# Patient Record
Sex: Female | Born: 1945 | Race: White | Hispanic: No | Marital: Married | Smoking: Former smoker
Health system: Southern US, Community
[De-identification: ages and names within clinical notes are randomized; demographics above are authoritative.]

## PROBLEM LIST (undated history)

## (undated) DIAGNOSIS — N8502 Endometrial intraepithelial neoplasia [EIN]: Secondary | ICD-10-CM

## (undated) DIAGNOSIS — K589 Irritable bowel syndrome without diarrhea: Secondary | ICD-10-CM

## (undated) DIAGNOSIS — E039 Hypothyroidism, unspecified: Secondary | ICD-10-CM

## (undated) DIAGNOSIS — R053 Chronic cough: Secondary | ICD-10-CM

## (undated) DIAGNOSIS — D649 Anemia, unspecified: Secondary | ICD-10-CM

## (undated) DIAGNOSIS — M797 Fibromyalgia: Secondary | ICD-10-CM

## (undated) DIAGNOSIS — G43909 Migraine, unspecified, not intractable, without status migrainosus: Secondary | ICD-10-CM

## (undated) DIAGNOSIS — R5382 Chronic fatigue, unspecified: Secondary | ICD-10-CM

## (undated) DIAGNOSIS — M1990 Unspecified osteoarthritis, unspecified site: Secondary | ICD-10-CM

## (undated) DIAGNOSIS — E785 Hyperlipidemia, unspecified: Secondary | ICD-10-CM

## (undated) DIAGNOSIS — E079 Disorder of thyroid, unspecified: Secondary | ICD-10-CM

## (undated) HISTORY — DX: Endometrial intraepithelial neoplasia (EIN): N85.02

## (undated) HISTORY — DX: Disorder of thyroid, unspecified: E07.9

## (undated) HISTORY — PX: WISDOM TOOTH EXTRACTION: SHX21

## (undated) HISTORY — PX: APPENDECTOMY: SHX54

## (undated) HISTORY — DX: Irritable bowel syndrome, unspecified: K58.9

## (undated) HISTORY — DX: Chronic cough: R05.3

## (undated) HISTORY — PX: OTHER SURGICAL HISTORY: SHX169

## (undated) HISTORY — DX: Chronic fatigue, unspecified: R53.82

## (undated) HISTORY — PX: TONSILLECTOMY: SUR1361

## (undated) HISTORY — PX: TEMPOROMANDIBULAR JOINT SURGERY: SHX35

## (undated) HISTORY — PX: COLONOSCOPY: SHX174

## (undated) HISTORY — DX: Hyperlipidemia, unspecified: E78.5

## (undated) HISTORY — DX: Fibromyalgia: M79.7

---

## 1998-03-17 ENCOUNTER — Encounter: Payer: Self-pay | Admitting: Family Medicine

## 1998-03-17 ENCOUNTER — Ambulatory Visit (HOSPITAL_COMMUNITY): Admission: RE | Admit: 1998-03-17 | Discharge: 1998-03-17 | Payer: Self-pay | Admitting: Family Medicine

## 1998-05-09 ENCOUNTER — Other Ambulatory Visit: Admission: RE | Admit: 1998-05-09 | Discharge: 1998-05-09 | Payer: Self-pay | Admitting: Obstetrics & Gynecology

## 1999-02-15 ENCOUNTER — Ambulatory Visit (HOSPITAL_COMMUNITY): Admission: RE | Admit: 1999-02-15 | Discharge: 1999-02-15 | Payer: Self-pay | Admitting: Obstetrics & Gynecology

## 1999-02-15 ENCOUNTER — Encounter (INDEPENDENT_AMBULATORY_CARE_PROVIDER_SITE_OTHER): Payer: Self-pay

## 1999-11-20 ENCOUNTER — Encounter: Admission: RE | Admit: 1999-11-20 | Discharge: 1999-11-20 | Payer: Self-pay | Admitting: Obstetrics & Gynecology

## 1999-11-20 ENCOUNTER — Encounter: Payer: Self-pay | Admitting: Obstetrics & Gynecology

## 2000-04-25 ENCOUNTER — Encounter (INDEPENDENT_AMBULATORY_CARE_PROVIDER_SITE_OTHER): Payer: Self-pay | Admitting: Specialist

## 2000-04-25 ENCOUNTER — Other Ambulatory Visit: Admission: RE | Admit: 2000-04-25 | Discharge: 2000-04-25 | Payer: Self-pay | Admitting: Plastic Surgery

## 2000-07-25 ENCOUNTER — Other Ambulatory Visit: Admission: RE | Admit: 2000-07-25 | Discharge: 2000-07-25 | Payer: Self-pay | Admitting: Obstetrics & Gynecology

## 2001-01-21 ENCOUNTER — Encounter: Admission: RE | Admit: 2001-01-21 | Discharge: 2001-01-21 | Payer: Self-pay | Admitting: Obstetrics & Gynecology

## 2001-01-21 ENCOUNTER — Encounter: Payer: Self-pay | Admitting: Obstetrics & Gynecology

## 2001-08-05 ENCOUNTER — Other Ambulatory Visit: Admission: RE | Admit: 2001-08-05 | Discharge: 2001-08-05 | Payer: Self-pay | Admitting: Obstetrics & Gynecology

## 2001-09-09 ENCOUNTER — Ambulatory Visit (HOSPITAL_COMMUNITY): Admission: RE | Admit: 2001-09-09 | Discharge: 2001-09-09 | Payer: Self-pay | Admitting: Gastroenterology

## 2001-09-09 ENCOUNTER — Encounter (INDEPENDENT_AMBULATORY_CARE_PROVIDER_SITE_OTHER): Payer: Self-pay | Admitting: Specialist

## 2002-01-27 ENCOUNTER — Encounter: Payer: Self-pay | Admitting: Obstetrics & Gynecology

## 2002-01-27 ENCOUNTER — Encounter: Admission: RE | Admit: 2002-01-27 | Discharge: 2002-01-27 | Payer: Self-pay | Admitting: Obstetrics & Gynecology

## 2002-05-31 ENCOUNTER — Emergency Department (HOSPITAL_COMMUNITY): Admission: EM | Admit: 2002-05-31 | Discharge: 2002-06-01 | Payer: Self-pay | Admitting: Emergency Medicine

## 2002-05-31 ENCOUNTER — Encounter: Payer: Self-pay | Admitting: Emergency Medicine

## 2002-09-01 ENCOUNTER — Other Ambulatory Visit: Admission: RE | Admit: 2002-09-01 | Discharge: 2002-09-01 | Payer: Self-pay | Admitting: Obstetrics & Gynecology

## 2003-02-08 ENCOUNTER — Encounter: Payer: Self-pay | Admitting: Internal Medicine

## 2003-04-30 ENCOUNTER — Encounter: Admission: RE | Admit: 2003-04-30 | Discharge: 2003-04-30 | Payer: Self-pay | Admitting: Obstetrics & Gynecology

## 2003-10-13 ENCOUNTER — Other Ambulatory Visit: Admission: RE | Admit: 2003-10-13 | Discharge: 2003-10-13 | Payer: Self-pay | Admitting: Obstetrics & Gynecology

## 2004-02-01 ENCOUNTER — Ambulatory Visit: Payer: Self-pay | Admitting: Internal Medicine

## 2004-05-09 ENCOUNTER — Encounter: Admission: RE | Admit: 2004-05-09 | Discharge: 2004-05-09 | Payer: Self-pay | Admitting: Obstetrics & Gynecology

## 2004-11-08 ENCOUNTER — Other Ambulatory Visit: Admission: RE | Admit: 2004-11-08 | Discharge: 2004-11-08 | Payer: Self-pay | Admitting: Obstetrics & Gynecology

## 2005-05-16 ENCOUNTER — Encounter: Admission: RE | Admit: 2005-05-16 | Discharge: 2005-05-16 | Payer: Self-pay | Admitting: Obstetrics & Gynecology

## 2006-01-28 ENCOUNTER — Ambulatory Visit: Payer: Self-pay | Admitting: Internal Medicine

## 2006-02-08 ENCOUNTER — Ambulatory Visit: Payer: Self-pay | Admitting: Internal Medicine

## 2006-02-08 LAB — CONVERTED CEMR LAB
Cholesterol: 212 mg/dL (ref 0–200)
LDL DIRECT: 138.4 mg/dL
T3, Free: 2.8 pg/mL (ref 2.3–4.2)
VLDL: 11 mg/dL (ref 0–40)

## 2006-03-08 ENCOUNTER — Ambulatory Visit: Payer: Self-pay | Admitting: Internal Medicine

## 2006-04-24 ENCOUNTER — Ambulatory Visit: Payer: Self-pay | Admitting: Internal Medicine

## 2006-06-12 ENCOUNTER — Encounter: Admission: RE | Admit: 2006-06-12 | Discharge: 2006-06-12 | Payer: Self-pay | Admitting: Obstetrics & Gynecology

## 2006-08-28 DIAGNOSIS — R946 Abnormal results of thyroid function studies: Secondary | ICD-10-CM

## 2006-08-28 DIAGNOSIS — R5382 Chronic fatigue, unspecified: Secondary | ICD-10-CM

## 2006-08-29 ENCOUNTER — Encounter: Payer: Self-pay | Admitting: Internal Medicine

## 2006-08-29 ENCOUNTER — Ambulatory Visit: Payer: Self-pay | Admitting: Internal Medicine

## 2006-08-29 LAB — CONVERTED CEMR LAB
Basophils Absolute: 0 10*3/uL (ref 0.0–0.1)
Eosinophils Absolute: 0.1 10*3/uL (ref 0.0–0.6)
Lymphocytes Relative: 36.8 % (ref 12.0–46.0)
MCHC: 34.2 g/dL (ref 30.0–36.0)
MCV: 91.5 fL (ref 78.0–100.0)
Monocytes Relative: 10 % (ref 3.0–11.0)
Platelets: 212 10*3/uL (ref 150–400)
RBC: 4.42 M/uL (ref 3.87–5.11)
Rhuematoid fact SerPl-aCnc: <20 intl units/mL — ABNORMAL LOW (ref 0.0–20.0)
Sed Rate: 8 mm/hr (ref 0–25)
Total CK: 47 units/L (ref 7–177)
Uric Acid, Serum: 2.9 mg/dL (ref 2.4–7.0)
WBC: 5.6 10*3/uL (ref 4.5–10.5)

## 2006-09-09 ENCOUNTER — Telehealth (INDEPENDENT_AMBULATORY_CARE_PROVIDER_SITE_OTHER): Payer: Self-pay | Admitting: *Deleted

## 2007-02-10 ENCOUNTER — Encounter: Payer: Self-pay | Admitting: Internal Medicine

## 2007-06-13 ENCOUNTER — Encounter: Admission: RE | Admit: 2007-06-13 | Discharge: 2007-06-13 | Payer: Self-pay | Admitting: Obstetrics & Gynecology

## 2007-06-23 ENCOUNTER — Encounter: Payer: Self-pay | Admitting: Internal Medicine

## 2007-12-15 ENCOUNTER — Encounter: Payer: Self-pay | Admitting: Internal Medicine

## 2008-01-15 ENCOUNTER — Encounter: Payer: Self-pay | Admitting: Internal Medicine

## 2008-07-30 ENCOUNTER — Encounter: Admission: RE | Admit: 2008-07-30 | Discharge: 2008-07-30 | Payer: Self-pay | Admitting: Obstetrics & Gynecology

## 2008-08-02 ENCOUNTER — Telehealth (INDEPENDENT_AMBULATORY_CARE_PROVIDER_SITE_OTHER): Payer: Self-pay | Admitting: *Deleted

## 2008-08-03 ENCOUNTER — Ambulatory Visit: Payer: Self-pay | Admitting: Internal Medicine

## 2008-08-03 LAB — CONVERTED CEMR LAB
BUN: 15 mg/dL (ref 6–23)
Bilirubin, Direct: 0 mg/dL (ref 0.0–0.3)
Cholesterol: 208 mg/dL — ABNORMAL HIGH (ref 0–200)
Direct LDL: 127.6 mg/dL
Eosinophils Relative: 1.2 % (ref 0.0–5.0)
Free T4: 0.8 ng/dL (ref 0.6–1.6)
HDL: 71.5 mg/dL (ref >39.00)
Hemoglobin: 13 g/dL (ref 12.0–15.0)
Lymphocytes Relative: 33.8 % (ref 12.0–46.0)
Lymphs Abs: 1.8 10*3/uL (ref 0.7–4.0)
Monocytes Relative: 8.4 % (ref 3.0–12.0)
Potassium: 4.7 meq/L (ref 3.5–5.1)
RBC: 4.01 M/uL (ref 3.87–5.11)
T3, Free: 2.1 pg/mL — ABNORMAL LOW (ref 2.3–4.2)
Total Bilirubin: 0.6 mg/dL (ref 0.3–1.2)
Total CHOL/HDL Ratio: 3
Total Protein: 7 g/dL (ref 6.0–8.3)
VLDL: 12.4 mg/dL (ref 0.0–40.0)

## 2008-08-05 LAB — CONVERTED CEMR LAB: Vit D, 25-Hydroxy: 34 ng/mL (ref 30–89)

## 2008-08-06 ENCOUNTER — Encounter (INDEPENDENT_AMBULATORY_CARE_PROVIDER_SITE_OTHER): Payer: Self-pay | Admitting: *Deleted

## 2008-08-12 ENCOUNTER — Ambulatory Visit: Payer: Self-pay | Admitting: Internal Medicine

## 2008-08-12 DIAGNOSIS — H9319 Tinnitus, unspecified ear: Secondary | ICD-10-CM | POA: Insufficient documentation

## 2008-08-12 DIAGNOSIS — E785 Hyperlipidemia, unspecified: Secondary | ICD-10-CM

## 2008-12-20 ENCOUNTER — Encounter: Payer: Self-pay | Admitting: Internal Medicine

## 2009-01-26 ENCOUNTER — Telehealth (INDEPENDENT_AMBULATORY_CARE_PROVIDER_SITE_OTHER): Payer: Self-pay | Admitting: *Deleted

## 2009-02-14 ENCOUNTER — Encounter: Payer: Self-pay | Admitting: Internal Medicine

## 2009-02-14 ENCOUNTER — Ambulatory Visit: Payer: Self-pay | Admitting: Internal Medicine

## 2009-03-18 ENCOUNTER — Encounter (INDEPENDENT_AMBULATORY_CARE_PROVIDER_SITE_OTHER): Payer: Self-pay | Admitting: *Deleted

## 2009-08-17 ENCOUNTER — Encounter: Admission: RE | Admit: 2009-08-17 | Discharge: 2009-08-17 | Payer: Self-pay | Admitting: Obstetrics & Gynecology

## 2009-11-11 ENCOUNTER — Ambulatory Visit: Payer: Self-pay | Admitting: Internal Medicine

## 2009-11-11 DIAGNOSIS — M25519 Pain in unspecified shoulder: Secondary | ICD-10-CM

## 2009-11-11 DIAGNOSIS — M2550 Pain in unspecified joint: Secondary | ICD-10-CM | POA: Insufficient documentation

## 2009-11-11 DIAGNOSIS — M545 Low back pain: Secondary | ICD-10-CM

## 2009-11-11 LAB — CONVERTED CEMR LAB
Blood in Urine, dipstick: NEGATIVE
Glucose, Urine, Semiquant: NEGATIVE
Protein, U semiquant: NEGATIVE
WBC Urine, dipstick: NEGATIVE
pH: 5

## 2009-11-14 LAB — CONVERTED CEMR LAB: Sed Rate: 9 mm/hr (ref 0–22)

## 2009-12-08 ENCOUNTER — Encounter: Payer: Self-pay | Admitting: Internal Medicine

## 2009-12-09 ENCOUNTER — Ambulatory Visit: Payer: Self-pay | Admitting: Internal Medicine

## 2009-12-16 ENCOUNTER — Telehealth (INDEPENDENT_AMBULATORY_CARE_PROVIDER_SITE_OTHER): Payer: Self-pay | Admitting: *Deleted

## 2010-01-12 ENCOUNTER — Encounter: Payer: Self-pay | Admitting: Internal Medicine

## 2010-02-06 ENCOUNTER — Telehealth: Payer: Self-pay | Admitting: Internal Medicine

## 2010-02-27 ENCOUNTER — Encounter: Payer: Self-pay | Admitting: Internal Medicine

## 2010-03-31 ENCOUNTER — Encounter: Payer: Self-pay | Admitting: Internal Medicine

## 2010-05-02 NOTE — Progress Notes (Signed)
Summary: Neurosurgeon referral  Phone Note Call from Patient Call back at Home Phone [phone]   Summary of Call: Patient has been seen by Dr. 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 Dr. Danielle Dess has been recommended and she wants to know if you agree on this MD or if you prefer someone else. Please advise.  Patient would also like to know if you will make this referral or should it be made by someone else. Initial call taken by: Lucious Groves CMA,  February 06, 2010 10:10 AM  Follow-up for Phone Call        Patient notified that Hop concurs with this MD and that he has not received anything on this patient. She is aware that referral should be done by Dr. Ethelene Hal. Follow-up by: Lucious Groves CMA,  February 06, 2010 10:49 AM

## 2010-05-02 NOTE — Assessment & Plan Note (Signed)
Summary: BACK PAIN/KN   Vital Signs:  Patient profile:   65 year old female Weight:      144.6 pounds Temp:     97.9 degrees F oral Pulse rate:   72 / minute Resp:     15 per minute BP sitting:   120 / 82  (left arm) Cuff size:   large  Vitals Entered By: Shonna Chock CMA (December 09, 2009 8:18 AM) CC: Onging Back pain and arm concerns, back pain is much worse   CC:  Onging Back pain and arm concerns and back pain is much worse.  History of Present Illness: Dr Felicita Gage ,PT ,has been treating her with concentration on LUE. Her back is aculely worse as of 12/06/2009 after standing up. The patient denies fever, chills, weakness, loss of sensation, fecal incontinence, urinary incontinence, and urinary retention.  The sharp  pain is located in the right  buttock &  radiates to the right leg behind  the knee.  The pain is made worse by turning to either side in bed.  Current Medications (verified): 1)  Vivelle 0.1 Mg/24hr Pttw (Estradiol) .... 2 X Weekly 2)  Prometrium 100 Mg Caps (Progesterone Micronized) .... First 15 Days of Every Other Month 3)  Synthroid 50 Mcg Tabs (Levothyroxine Sodium) .Marland Kitchen.. 1 1/2 Tab Daily 4)  Trazodone Hcl 100 Mg Tabs (Trazodone Hcl) .Marland Kitchen.. 1 By Mouth At Bedtime 5)  Klonopin 0.5 Mg Tabs (Clonazepam) .... 1/4 Tab At Bedtime 6)  Finesteride 5mg  .... 1/2 By Mouth Once Daily 7)  Tramadol Hcl 50 Mg Tabs (Tramadol Hcl) .... As Needed 8)  Ambien 10 Mg Tabs (Zolpidem Tartrate) .Marland Kitchen.. 1 By Mouth At Bedtime As Needed 9)  Singulair 10 Mg Tabs (Montelukast Sodium) .Marland Kitchen.. 1 Once Daily As Needed Allergies & Dry Cough  Allergies: 1)  ! Sulfa 2)  ! Hydrocodone 3)  ! Nasonex (Mometasone Furoate)  Physical Exam  General:  Thin but well-nourished,in no acute distress; alert,appropriate and cooperative throughout examination Extremities:  No clubbing, cyanosis, edema.Neg SLR Neurologic:  alert & oriented X3, strength normal in all extremities, heel/toe gait normal, and DTRs  symmetrical and normal.   Skin:  Intact without suspicious lesions or rashes   Impression & Recommendations:  Problem # 1:  LOW BACK PAIN SYNDROME (ICD-724.2)  Her updated medication list for this problem includes:    Tramadol Hcl 50 Mg Tabs (Tramadol hcl) .Marland Kitchen... As needed    Cyclobenzaprine Hcl 5 Mg Tabs (Cyclobenzaprine hcl) .Marland Kitchen... 1-2 at bedtime as needed  Orders: T-Lumbar Spine Complete, 5 Views (71110TC)  Complete Medication List: 1)  Vivelle 0.1 Mg/24hr Pttw (Estradiol) .... 2 x weekly 2)  Prometrium 100 Mg Caps (Progesterone micronized) .... First 15 days of every other month 3)  Synthroid 50 Mcg Tabs (Levothyroxine sodium) .Marland Kitchen.. 1 1/2 tab daily 4)  Trazodone Hcl 100 Mg Tabs (Trazodone hcl) .Marland Kitchen.. 1 by mouth at bedtime 5)  Klonopin 0.5 Mg Tabs (Clonazepam) .... 1/4 tab at bedtime 6)  Finesteride 5mg   .... 1/2 by mouth once daily 7)  Tramadol Hcl 50 Mg Tabs (Tramadol hcl) .... As needed 8)  Ambien 10 Mg Tabs (Zolpidem tartrate) .Marland Kitchen.. 1 by mouth at bedtime as needed 9)  Singulair 10 Mg Tabs (Montelukast sodium) .Marland Kitchen.. 1 once daily as needed allergies & dry cough 10)  Gabapentin 100 Mg Caps (Gabapentin) .Marland Kitchen.. 1 every 8 hrs as needed for leg pain 11)  Cyclobenzaprine Hcl 5 Mg Tabs (Cyclobenzaprine hcl) .Marland Kitchen.. 1-2 at bedtime as  needed  Patient Instructions: 1)  Consider referral to Dr Sheran Luz , Back Specialist  if no better. Prescriptions: CYCLOBENZAPRINE HCL 5 MG TABS (CYCLOBENZAPRINE HCL) 1-2 at bedtime as needed  #20 x 0   Entered and Authorized by:   Marga Melnick MD   Signed by:   Marga Melnick MD on 12/09/2009   Method used:   Print then Give to Patient   RxID:   [id number] GABAPENTIN 100 MG CAPS (GABAPENTIN) 1 every 8 hrs as needed for leg pain  #30 x 1   Entered and Authorized by:   Marga Melnick MD   Signed by:   Marga Melnick MD on 12/09/2009   Method used:   Print then Give to Patient   RxID:   [id number]

## 2010-05-02 NOTE — Progress Notes (Signed)
Summary: tramadol not helping shoulder  Phone Note Call from Patient Call back at Home Phone [phone]   Caller: Patient Summary of Call: patient called and would like to get something stronger than tramadol for shoulder pain b/c she says that it isn't helping says the neurontin helps w/ hip pain but she had a hard time doing physical therapy today due to shoulder discomfort and says that physical therapist things she may have so issue going on in her neck instead of rotator cuff so she was in traction and done some heat therapy today.  Hop pls advise.  [facility]  Initial call taken by: Doristine Devoid CMA,  December 16, 2009 4:23 PM  Follow-up for Phone Call        Tylenol # 3 if she can take this #30.1-2 every 6 hrs as needed . Orthopdic referral needed if no better Follow-up by: Marga Melnick MD,  December 16, 2009 4:48 PM  Additional Follow-up for Phone Call Additional follow up Details #1::        spoke w/ patient aware prescription to be called into pharmacy and would like to go ahead w/ orthopaedic referral...Marland KitchenMarland KitchenDoristine Devoid CMA  December 16, 2009 4:54 PM     New/Updated Medications: TYLENOL WITH CODEINE #3 300-30 MG TABS (ACETAMINOPHEN-CODEINE) take 1-2 tablets every 6 hours as needed for pain Prescriptions: TYLENOL WITH CODEINE #3 300-30 MG TABS (ACETAMINOPHEN-CODEINE) take 1-2 tablets every 6 hours as needed for pain  #30 x 0   Entered by:   Doristine Devoid CMA   Authorized by:   Marga Melnick MD   Signed by:   Doristine Devoid CMA on 12/16/2009   Method used:   Printed then faxed to ...       [facility]  [address] [phone]* (retail)       [address]       [place], [place]  [address]       Ph: [phone] or [phone]       Fax: [phone]   RxID:   [id number]

## 2010-05-02 NOTE — Letter (Signed)
Summary: [facility]   Imported By: Lanelle Bal 01/24/2010 10:42:10  _____________________________________________________________________  External Attachment:    Type:   Image     Comment:   External Document

## 2010-05-02 NOTE — Assessment & Plan Note (Signed)
Summary: lower bacl pain & shoulder pain/cbs   Vital Signs:  Patient profile:   65 year old female Weight:      145 pounds BMI:     22.62 Temp:     97.7 degrees F oral Pulse rate:   64 / minute Pulse rhythm:   regular Resp:     15 per minute BP sitting:   102 / 68  (left arm)  Vitals Entered By: Almeta Monas CMA Duncan Dull) (November 11, 2009 10:21 AM) CC: c/o back pain in the tailbone area and left shoulder pain. Pt is concerned about arthritis and she also requested a refill on her singular 10mg ., Back pain, Lower Extremity Joint pain   CC:  c/o back pain in the tailbone area and left shoulder pain. Pt is concerned about arthritis and she also requested a refill on her singular 10mg ., Back pain, and Lower Extremity Joint pain.  History of Present Illness:  Back Pain      This is a 65 year old woman who presents with Back pain for several months w/o trigger or injury.  The patient denies fever, chills, weakness, loss of sensation, fecal incontinence, urinary incontinence, urinary retention, dysuria, and inability to care for self.  The pain is located in the mid low back.  The pain began gradually.  The pain radiates to the right hip.  The pain is made worse by activity; it is actually worse in am but better with Tramadol ,  NSAID medications and ice.  Risk factors for serious underlying conditions include duration of pain > 1 month and age >= 50 years.   L Shoulder Joint Pain      The patient also presents with L shoulder pain.  The patient reports stiffness for >1 hr, decreased ROM, and weakness, but denies swelling, redness, locking, and popping.  The pain is located in the left  shoulder & upper arm.  The pain began without  injury.  She had rotator cuff impingement in the RIGHT shoulder after a fall 2008.This responded to Physical Therapy; the L shoulder pain is "EXACTLY" like that in 2008. The pain is described as aching - sharp  and constant.  To date   evaluation of L shoulder has  included Xray & manipulation  by Chiropractry & massage w/o lasting response  .  The patient denies the following symptoms: fever, photosensitivity, eye symptoms, and diarrhea.   FH of Lupus ( aunt)  & strong FH "arthritis"  Current Medications (verified): 1)  Vivelle 0.1 Mg/24hr Pttw (Estradiol) .... 2 X Weekly 2)  Prometrium 100 Mg Caps (Progesterone Micronized) .... First 15 Days of Every Other Month 3)  Synthroid 50 Mcg Tabs (Levothyroxine Sodium) .Marland Kitchen.. 1 1/2 Tab Daily 4)  Trazodone Hcl 100 Mg Tabs (Trazodone Hcl) .Marland Kitchen.. 1 By Mouth At Bedtime 5)  Klonopin 0.5 Mg Tabs (Clonazepam) .... 1/4 Tab At Bedtime 6)  Finesteride 5mg  .... 1/2 By Mouth Once Daily 7)  Tramadol Hcl 50 Mg Tabs (Tramadol Hcl) .... As Needed 8)  Ambien 10 Mg Tabs (Zolpidem Tartrate) .Marland Kitchen.. 1 By Mouth At Bedtime As Needed 9)  Singulair 10 Mg Tabs (Montelukast Sodium) .Marland Kitchen.. 1 Once Daily As Needed Allergies & Dry Cough  Allergies (verified): 1)  ! Sulfa 2)  ! Hydrocodone 3)  ! Nasonex (Mometasone Furoate)  Review of Systems General:  Denies chills, sweats, and weight loss. Eyes:  Denies discharge, eye pain, red eye, and vision loss-both eyes. GU:  Denies discharge and hematuria. MS:  Occasional stiffness of fingers in am for < 60 min. Occa pain in knees & inguinal areas.  Physical Exam  General:  Thin  but well-nourished,in no acute distress; alert,appropriate and cooperative throughout examination Eyes:  No corneal or conjunctival inflammation noted.Perrla. No icterus Neck:  No deformities, masses, or tenderness noted. Good ROM Abdomen:  Bowel sounds positive,abdomen soft and non-tender without masses, organomegaly or hernias noted. Msk:  No deformity or scoliosis noted of thoracic or lumbar spine.   Extremities:  No clubbing, cyanosis, edema, or deformity noted with normal full range of motion of all joints.  Neg SLR . Mild crepitus L knee. Decreased ROM LUE @ shoulder  Neurologic:  alert & oriented X3, strength normal  in all extremities, gait normal, and DTRs symmetrical and normal.   Skin:  Intact without suspicious lesions or rashes. No jaundice Cervical Nodes:  No lymphadenopathy noted Axillary Nodes:  No palpable lymphadenopathy Psych:  memory intact for recent and remote and normally interactive.     Impression & Recommendations:  Problem # 1:  LOW BACK PAIN SYNDROME (ICD-724.2)  Her updated medication list for this problem includes:    Tramadol Hcl 50 Mg Tabs (Tramadol hcl) .Marland Kitchen... As needed  Orders: Physical Therapy Referral (PT)  Problem # 2:  SHOULDER PAIN, LEFT (ICD-719.41)  Her updated medication list for this problem includes:    Tramadol Hcl 50 Mg Tabs (Tramadol hcl) .Marland Kitchen... As needed  Orders: Physical Therapy Referral (PT)  Problem # 3:  ARTHRALGIA (ICD-719.40)  Diffuse joint involvement  Orders: Venipuncture (91478) TLB-ALT (SGPT) (84460-ALT) TLB-AST (SGOT) (84450-SGOT) TLB-Sedimentation Rate (ESR) (85652-ESR) TLB-Rheumatoid Factor (RA) (29562-ZH)  Complete Medication List: 1)  Vivelle 0.1 Mg/24hr Pttw (Estradiol) .... 2 x weekly 2)  Prometrium 100 Mg Caps (Progesterone micronized) .... First 15 days of every other month 3)  Synthroid 50 Mcg Tabs (Levothyroxine sodium) .Marland Kitchen.. 1 1/2 tab daily 4)  Trazodone Hcl 100 Mg Tabs (Trazodone hcl) .Marland Kitchen.. 1 by mouth at bedtime 5)  Klonopin 0.5 Mg Tabs (Clonazepam) .... 1/4 tab at bedtime 6)  Finesteride 5mg   .... 1/2 by mouth once daily 7)  Tramadol Hcl 50 Mg Tabs (Tramadol hcl) .... As needed 8)  Ambien 10 Mg Tabs (Zolpidem tartrate) .Marland Kitchen.. 1 by mouth at bedtime as needed 9)  Singulair 10 Mg Tabs (Montelukast sodium) .Marland Kitchen.. 1 once daily as needed allergies & dry cough  Other Orders: UA Dipstick w/o Micro (manual) (08657)   Laboratory Results   Urine Tests    Routine Urinalysis   Color: yellow Appearance: Clear Glucose: negative   (Normal Range: Negative) Bilirubin: negative   (Normal Range: Negative) Ketone: negative   (Normal  Range: Negative) Spec. Gravity: <1.005   (Normal Range: 1.003-1.035) Blood: negative   (Normal Range: Negative) pH: 5.0   (Normal Range: 5.0-8.0) Protein: negative   (Normal Range: Negative) Urobilinogen: 0.2   (Normal Range: 0-1) Nitrite: negative   (Normal Range: Negative) Leukocyte Esterace: negative   (Normal Range: Negative)       Appended Document: lower bacl pain & shoulder pain/cbs

## 2010-05-02 NOTE — Miscellaneous (Signed)
Summary: PT Report/[facility]  PT Report/[facility]   Imported By: Lanelle Bal 12/20/2009 10:17:58  _____________________________________________________________________  External Attachment:    Type:   Image     Comment:   External Document

## 2010-05-04 NOTE — Letter (Signed)
Summary: [facility]  [facility]   Imported By: Lanelle Bal 03/15/2010 13:18:44  _____________________________________________________________________  External Attachment:    Type:   Image     Comment:   External Document

## 2010-05-09 ENCOUNTER — Encounter: Payer: Self-pay | Admitting: Internal Medicine

## 2010-05-18 NOTE — Letter (Signed)
Summary: [facility]  [facility]   Imported By: Maryln Gottron 05/12/2010 10:47:17  _____________________________________________________________________  External Attachment:    Type:   Image     Comment:   External Document

## 2010-05-24 NOTE — Miscellaneous (Signed)
Summary: Discharge Summary/[facility]  Discharge Summary/[facility]   Imported By: Maryln Gottron 05/15/2010 13:42:25  _____________________________________________________________________  External Attachment:    Type:   Image     Comment:   External Document

## 2010-08-18 NOTE — Letter (Signed)
April 24, 2006    Tasia Catchings, M.D.  [address]  [address]  [place],  [place] [address]   RE:  [name], EY  MRN:  [id number]  /  DOB:  [birth year]   Dear Rosanne Ashing:   Helen Gardner is scheduled to see you in June 2008 for followup colonoscopy. She  has had two colonoscopies because of family history of colon cancer and  because of irritable bowel symptoms associated with rectal bleeding  intermittently.   Specifically, her mother had colon cancer and also pancreatic cancer.  Her maternal grandfather had pancreatic cancer and maternal great-  grandfather had colon cancer.   She has had irritable bowel symptoms for 15 years, but there has been  progression of symptoms over the last year. She actually has had  intermittent bleeding, but the major symptoms are alternating  constipation and diarrhea.   Her weight was down approximately 2-1/2 pounds to 138. She had no  lymphadenopathy or organomegaly. She is thin and her aorta is palpable,  but there is no aneurysm.   I did provide her with samples of Align, pending her evaluation by you.   Other past medical history includes thyroglossal duct cyst removal,  tonsillectomy, appendectomy, two pregnancies, temporal mandibular joint  dysfunction and chronic fatigue.  She has normal bone density and is on adequate calcium; she has had  vitamin D deficiency demonstrated.   Thank you for your evaluation and recommendations.    Sincerely,      Titus Dubin. Alwyn Ren, MD,FACP,FCCP  Electronically Signed    [facility]  DD: 04/24/2006  DT: 04/24/2006  Job #: [id number]

## 2010-08-18 NOTE — Op Note (Signed)
[facility]  Patient:    Helen Gardner                         MRN: [id number] Proc. Date: 02/15/99 Adm. Date:  [id number] Attending:  Minette Headland                           Operative Report  PREOPERATIVE DIAGNOSIS:       Dysfunctional uterine bleeding, on hormone-replacement therapy, with ultrasound diagnosis of endometrial polyp.  POSTOPERATIVE DIAGNOSIS:      Endometrial polyp.  OPERATIVE PROCEDURE:          Hysteroscopy, dilatation and curettage and removal of polyp.  SURGEON:                      Freddy Finner, M.D.  ANESTHESIA:                   General.  INTRAOPERATIVE COMPLICATIONS:  None.  ESTIMATED INTRAOPERATIVE BLOOD LOSS:  Less than 50 cc.  SORBITOL DEFICIT FROM DISTENDING MEDIUM:  -50 cc.  INDICATIONS:                  Patient is a 65 year old who is on hormone-replacement therapy and has had dysfunction bleeding.  Pelvic ultrasound did reveal a mass in the uterus consistent with an endometrial polyp.  She is admitted now for hysteroscopy and D&C.  FINDINGS:                     Intraoperative finding did show one single polyp n the anterior fundal surface of the endometrium.  Photographs were made and retained in the office record.  DESCRIPTION OF PROCEDURE:     The patient was placed in the dorsal lithotomy position, placed under adequate general anesthesia, placed in the dorsal recumbent position and then placed in lithotomy using the Allen stirrup system.  Betadine  prep of perineum and vagina was carried out.  Sterile drapes were applied. Bivalved speculum was introduced.  Cervix was grasped with a single-tooth tenaculum and progressively dilated to 23 with Pratts.  The ACMI 25-degree diagnostic scope was used, using sorbitol with a pump for the distending medium.  Inspection revealed a polyp on the anterior fundal surface which was photographed.  The uterus was then explored with polyp forceps and gentle  thorough curettage was carried ut with a small sharp curette.  Reinspection revealed adequate removal of the polyp and adequate sampling of the endometrium.  The procedure was terminated at this  point.  The instruments were removed.  Patient was taken to recovery in good condition.  She will be given routine outpatient surgical instructions and follow up in the  office in a week. DD:  02/15/99 TD:  02/15/99 Job: [id number]

## 2010-09-18 ENCOUNTER — Other Ambulatory Visit: Payer: Self-pay | Admitting: Obstetrics & Gynecology

## 2010-09-18 DIAGNOSIS — Z1231 Encounter for screening mammogram for malignant neoplasm of breast: Secondary | ICD-10-CM

## 2010-09-27 ENCOUNTER — Ambulatory Visit
Admission: RE | Admit: 2010-09-27 | Discharge: 2010-09-27 | Disposition: A | Discharge status: Home / Self Care | Payer: BC Managed Care – PPO | Source: Ambulatory Visit | Attending: Obstetrics & Gynecology | Admitting: Obstetrics & Gynecology

## 2010-09-27 DIAGNOSIS — Z1231 Encounter for screening mammogram for malignant neoplasm of breast: Secondary | ICD-10-CM

## 2010-11-09 ENCOUNTER — Telehealth: Payer: Self-pay | Admitting: Internal Medicine

## 2010-11-09 NOTE — Telephone Encounter (Signed)
Patient has cpx scheduled [id number] - lab scheduled [phone] - need lab order

## 2010-11-10 NOTE — Telephone Encounter (Signed)
Lab order added to appt °

## 2010-11-10 NOTE — Telephone Encounter (Signed)
Lipid/Hep/BMP/CBCD/TSH/Stool Cards v70.0/272.4/794.5/995.20

## 2011-01-05 ENCOUNTER — Ambulatory Visit (INDEPENDENT_AMBULATORY_CARE_PROVIDER_SITE_OTHER): Payer: BC Managed Care – PPO

## 2011-01-05 ENCOUNTER — Inpatient Hospital Stay (INDEPENDENT_AMBULATORY_CARE_PROVIDER_SITE_OTHER)
Admission: RE | Admit: 2011-01-05 | Discharge: 2011-01-05 | Disposition: A | Discharge status: Home / Self Care | Payer: BC Managed Care – PPO | Source: Ambulatory Visit | Attending: Family Medicine | Admitting: Family Medicine

## 2011-01-05 DIAGNOSIS — S8000XA Contusion of unspecified knee, initial encounter: Secondary | ICD-10-CM

## 2011-01-05 DIAGNOSIS — S93409A Sprain of unspecified ligament of unspecified ankle, initial encounter: Secondary | ICD-10-CM

## 2011-01-09 ENCOUNTER — Other Ambulatory Visit: Payer: Self-pay | Admitting: Internal Medicine

## 2011-01-10 ENCOUNTER — Other Ambulatory Visit (INDEPENDENT_AMBULATORY_CARE_PROVIDER_SITE_OTHER): Payer: BC Managed Care – PPO

## 2011-01-10 ENCOUNTER — Other Ambulatory Visit: Payer: BC Managed Care – PPO

## 2011-01-10 DIAGNOSIS — E785 Hyperlipidemia, unspecified: Secondary | ICD-10-CM

## 2011-01-10 DIAGNOSIS — Z0000 Encounter for general adult medical examination without abnormal findings: Secondary | ICD-10-CM

## 2011-01-10 DIAGNOSIS — R946 Abnormal results of thyroid function studies: Secondary | ICD-10-CM

## 2011-01-10 DIAGNOSIS — T887XXA Unspecified adverse effect of drug or medicament, initial encounter: Secondary | ICD-10-CM

## 2011-01-10 LAB — HEPATIC FUNCTION PANEL
ALT: 15 U/L (ref 0–35)
AST: 23 U/L (ref 0–37)
Albumin: 4.2 g/dL (ref 3.5–5.2)
Total Bilirubin: 0.2 mg/dL — ABNORMAL LOW (ref 0.3–1.2)

## 2011-01-10 LAB — BASIC METABOLIC PANEL
BUN: 19 mg/dL (ref 6–23)
CO2: 28 mEq/L (ref 19–32)
Calcium: 9.4 mg/dL (ref 8.4–10.5)
Chloride: 108 mEq/L (ref 96–112)
Creatinine, Ser: 0.8 mg/dL (ref 0.4–1.2)
GFR: 81.63 mL/min (ref >60.00)
Potassium: 5.8 mEq/L — ABNORMAL HIGH (ref 3.5–5.1)

## 2011-01-10 LAB — LIPID PANEL
Cholesterol: 247 mg/dL — ABNORMAL HIGH (ref 0–200)
Total CHOL/HDL Ratio: 3

## 2011-01-10 LAB — CBC WITH DIFFERENTIAL/PLATELET
MCHC: 33.2 g/dL (ref 30.0–36.0)
Monocytes Absolute: 0.6 10*3/uL (ref 0.1–1.0)
Monocytes Relative: 8.8 % (ref 3.0–12.0)
Neutro Abs: 4 10*3/uL (ref 1.4–7.7)
RBC: 4.13 Mil/uL (ref 3.87–5.11)
RDW: 13.3 % (ref 11.5–14.6)

## 2011-01-10 LAB — LDL CHOLESTEROL, DIRECT: Direct LDL: 154.3 mg/dL

## 2011-01-10 NOTE — Progress Notes (Signed)
Labs only

## 2011-01-15 LAB — HEMOGLOBIN A1C: Hgb A1c MFr Bld: 5.3 % (ref 4.6–6.5)

## 2011-01-18 ENCOUNTER — Encounter: Payer: Self-pay | Admitting: Internal Medicine

## 2011-01-18 ENCOUNTER — Ambulatory Visit (INDEPENDENT_AMBULATORY_CARE_PROVIDER_SITE_OTHER): Payer: BC Managed Care – PPO | Admitting: Internal Medicine

## 2011-01-18 VITALS — BP 122/84 | HR 69 | Temp 97.7°F | Resp 12 | Ht 67.25 in | Wt 149.0 lb

## 2011-01-18 DIAGNOSIS — M25471 Effusion, right ankle: Secondary | ICD-10-CM

## 2011-01-18 DIAGNOSIS — G9332 Myalgic encephalomyelitis/chronic fatigue syndrome: Secondary | ICD-10-CM

## 2011-01-18 DIAGNOSIS — M25579 Pain in unspecified ankle and joints of unspecified foot: Secondary | ICD-10-CM

## 2011-01-18 DIAGNOSIS — M25571 Pain in right ankle and joints of right foot: Secondary | ICD-10-CM

## 2011-01-18 DIAGNOSIS — R5382 Chronic fatigue, unspecified: Secondary | ICD-10-CM

## 2011-01-18 DIAGNOSIS — Z0000 Encounter for general adult medical examination without abnormal findings: Secondary | ICD-10-CM

## 2011-01-18 DIAGNOSIS — E785 Hyperlipidemia, unspecified: Secondary | ICD-10-CM

## 2011-01-18 DIAGNOSIS — M25476 Effusion, unspecified foot: Secondary | ICD-10-CM

## 2011-01-18 DIAGNOSIS — R946 Abnormal results of thyroid function studies: Secondary | ICD-10-CM

## 2011-01-18 NOTE — Progress Notes (Signed)
Subjective:    Patient ID: Helen Gardner, female    DOB: 1945-09-01, 65 y.o.   MRN: [id number]  HPI  [name]  is here for a physical;acute issues include trauma to R knee & ankle post fall on steep , slick grade 2 weeks ago with residual swelling @ ankle      Review of Systems Thyroid function monitor  Medications status(change in dose/brand/mode of administration):no Constitutional: Weight change: weight up 5 #; Fatigue:chronic; Sleep pattern:intermittent awakening; Appetite:good  Visual change(blurred/diplopia/visual loss):no Hoarseness:no; Swallowing issues:no Cardiovascular: Palpitations:no; Racing:no; Irregularity:no GI: Constipation:no; Diarrhea:intermittently loose to watery Derm: Change in nails/hair/skin:hair loss Neuro: Numbness/tingling:no; Tremor:no Psych: Anxiety:no; Depression:no Endo: Temperature intolerance: Heat:@ night; Cold:no        Objective:   Physical Exam Gen.: Thin but healthy and well-nourished in appearance. Alert, appropriate and cooperative throughout exam. Head: Normocephalic without obvious abnormalities  Eyes: No corneal or conjunctival inflammation noted. Pupils equal round reactive to light and accommodation. Fundal exam is benign without hemorrhages, exudate, papilledema. Extraocular motion intact. Vision grossly normal with lenses. No lid lag. Ears: External  ear exam reveals no significant lesions or deformities. Canals clear .TMs normal. Hearing is grossly normal bilaterally. Nose: External nasal exam reveals no deformity or inflammation. Nasal mucosa are pink and moist. No lesions or exudates noted. Septum  : slight dislocation to L  Mouth: Oral mucosa and oropharynx reveal no lesions or exudates. Teeth in good repair. Neck: No deformities, masses, or tenderness noted. Range of motion &. Thyroid normal. Lungs: Normal respiratory effort; chest expands symmetrically. Lungs are clear to auscultation without rales, wheezes, or increased work of  breathing. Heart: Normal rate and rhythm. Normal S1 ; accentuated  S2. No gallop, click, or rub. Grade 1/6 LLSB systolic  murmur. Abdomen: Bowel sounds normal; abdomen soft and nontender. No masses, organomegaly or hernias noted. Aorta palpable Genitalia: Dr Jennette Kettle   .                                                                                   Musculoskeletal/extremities: No deformity or scoliosis noted of  the thoracic or lumbar spine. No clubbing, cyanosis,  noted. Range of motion  normal .Tone & strength  Normal.Joints:MINIMAL DIP changes. Nail health  Good. Fusiform changes R ankle Vascular: Carotid, radial artery, dorsalis pedis and  posterior tibial pulses are full and equal. No bruits present. Neurologic: Alert and oriented x3. Deep tendon reflexes symmetrical and normal.          Skin: Intact without suspicious lesions or rashes. Lymph: No cervical, axillary  lymphadenopathy present. Psych: Mood and affect are normal. Normally interactive                                                                                         Assessment & Plan:  #1 comprehensive physical  exam; no acute findings #2 see Problem List with Assessments & Recommendations  #3 posttraumatic swelling and pain right ankle  #4 significantly suppressed TSH  #5 LDL not at goal of at least less than 120. HDL is protective Plan: see Orders   EKG reveals occasional PACs. This may be related to the excess thyroid replacement.

## 2011-01-18 NOTE — Patient Instructions (Addendum)
Please review Dr Gildardo Griffes book Eat, Drink & Be Healthy for dietary cholesterol information. Please  schedule fasting Labs : [facility]  Lipid Panel  (no uric acid or TSH to be included in that panel) ; TSH after 10 weeks of dietary changes to optimally assess LDL risk.  Please bring these instructions to that Lab appt. Please decrease Synthroid from  125 mcg daily to 125 mcg daily except 100 mcg  on Wednesdays. TSH goal would be 1-3

## 2011-03-22 DIAGNOSIS — L219 Seborrheic dermatitis, unspecified: Secondary | ICD-10-CM | POA: Insufficient documentation

## 2011-05-25 ENCOUNTER — Other Ambulatory Visit: Payer: Self-pay | Admitting: Internal Medicine

## 2011-05-25 DIAGNOSIS — T887XXA Unspecified adverse effect of drug or medicament, initial encounter: Secondary | ICD-10-CM

## 2011-05-25 DIAGNOSIS — E039 Hypothyroidism, unspecified: Secondary | ICD-10-CM

## 2011-05-25 DIAGNOSIS — E785 Hyperlipidemia, unspecified: Secondary | ICD-10-CM

## 2011-05-28 ENCOUNTER — Other Ambulatory Visit: Payer: BC Managed Care – PPO

## 2011-05-28 DIAGNOSIS — T887XXA Unspecified adverse effect of drug or medicament, initial encounter: Secondary | ICD-10-CM

## 2011-05-28 DIAGNOSIS — E785 Hyperlipidemia, unspecified: Secondary | ICD-10-CM

## 2011-05-28 DIAGNOSIS — E039 Hypothyroidism, unspecified: Secondary | ICD-10-CM

## 2011-05-28 NOTE — Progress Notes (Unsigned)
Pt refused to have tsh drawn but did draw the [facility] labs

## 2011-06-25 ENCOUNTER — Telehealth: Payer: Self-pay | Admitting: *Deleted

## 2011-06-25 NOTE — Telephone Encounter (Signed)
Please verify results sent

## 2011-06-25 NOTE — Telephone Encounter (Signed)
Pt calling for results of labs done 05-28-11.Pt also had [facility] .Please advise

## 2011-06-26 NOTE — Telephone Encounter (Signed)
Patient called back and left message on voicemail indicating she would like report mailed to her

## 2011-06-26 NOTE — Telephone Encounter (Signed)
Left message on voicemail informing patient I have [facility] lab on my desk to be mailed out to patient. Patient can pick up if she would like quicker access. Patient to call and inform if ok to mail or if she would like to pick-up.

## 2011-07-13 ENCOUNTER — Encounter: Payer: Self-pay | Admitting: Internal Medicine

## 2011-07-13 ENCOUNTER — Ambulatory Visit (INDEPENDENT_AMBULATORY_CARE_PROVIDER_SITE_OTHER): Payer: BC Managed Care – PPO | Admitting: Internal Medicine

## 2011-07-13 VITALS — BP 120/70 | HR 64 | Wt 151.4 lb

## 2011-07-13 DIAGNOSIS — R946 Abnormal results of thyroid function studies: Secondary | ICD-10-CM

## 2011-07-13 DIAGNOSIS — E785 Hyperlipidemia, unspecified: Secondary | ICD-10-CM

## 2011-07-13 NOTE — Assessment & Plan Note (Signed)
These  advanced lipid tests do not suggest any significant increased long-term risk. I would have no concern unless the LDL rose above 160 and the HDL was less than 50.  Based on the special report she was concerned because of a insulin of 4. She is not insulin deficient as documented by her hemoglobin A1c 5.3%

## 2011-07-13 NOTE — Progress Notes (Signed)
  Subjective:    Patient ID: Helen Gardner, female    DOB: October 01, 1945, 66 y.o.   MRN: [id number]  HPI Dyslipidemia assessment: Prior Advanced Lipid Testing: both NMR & [place] [facility] Panel   Family history of premature CAD/ MI: no;MGF  MI @ 30 .  Nutrition: Mediaterranean .  Exercise: walking 2-3 mpd 4-5 X/week . Diabetes :no; A1c 5.3% in 10/12 . HTN: no. Smoking history  : quit 2000 .    Lab results reviewed : no risk identified    Review of Systems Weight :  Up 10 # over 18 mos.  fatigue: no ; chest pain : no ;claudication: no; palpitations: no; abd pain/bowel changes: alternating loose stool stool & constipation with IBS ; myalgias:no;  syncope :no ; memory loss: no;skin/hair/nail change: hair loss & nails thin.     Objective:   Physical Exam Gen.: Thin but well-nourished; in no acute distress Eyes: Extraocular motion intact; no lid lag or proptosis Neck: thyroid normal Heart: Normal rhythm and rate without significant murmur, gallop, or extra heart sounds Lungs: Chest clear to auscultation without rales,rales, wheezes Neuro:Deep tendon reflexes are equal and within normal limits; no tremor  Skin: Warm and dry without significant lesions or rashes; no onycholysis Psych: Normally communicative and interactive; no abnormal mood or affect clinically.         Assessment & Plan:

## 2011-07-13 NOTE — Assessment & Plan Note (Addendum)
Dr. Talmage Nap  has prescribed Armour Thyroid and is adjusting the dose. The last TSH in our records was 0.19 on 01/10/11. The risk of osteopenia exacerbation discussed with TSH less than 0.35.

## 2011-07-13 NOTE — Patient Instructions (Signed)
Preventive Health Care: Exercise  30-45  minutes a day, 3-4 days a week. Walking is especially valuable in preventing Osteoporosis. Eat a low-fat diet with lots of fruits and vegetables, up to 7-9 servings per day.  

## 2011-10-17 ENCOUNTER — Other Ambulatory Visit: Payer: Self-pay | Admitting: Obstetrics & Gynecology

## 2011-10-17 DIAGNOSIS — Z1231 Encounter for screening mammogram for malignant neoplasm of breast: Secondary | ICD-10-CM

## 2011-10-19 ENCOUNTER — Ambulatory Visit
Admission: RE | Admit: 2011-10-19 | Discharge: 2011-10-19 | Disposition: A | Discharge status: Home / Self Care | Payer: BC Managed Care – PPO | Source: Ambulatory Visit | Attending: Obstetrics & Gynecology | Admitting: Obstetrics & Gynecology

## 2011-10-19 DIAGNOSIS — Z1231 Encounter for screening mammogram for malignant neoplasm of breast: Secondary | ICD-10-CM

## 2012-07-01 ENCOUNTER — Other Ambulatory Visit: Payer: Self-pay | Admitting: Obstetrics & Gynecology

## 2012-11-04 ENCOUNTER — Other Ambulatory Visit: Payer: Self-pay

## 2012-11-04 DIAGNOSIS — Z1231 Encounter for screening mammogram for malignant neoplasm of breast: Secondary | ICD-10-CM

## 2012-11-13 DIAGNOSIS — E039 Hypothyroidism, unspecified: Secondary | ICD-10-CM | POA: Diagnosis not present

## 2012-11-17 DIAGNOSIS — N950 Postmenopausal bleeding: Secondary | ICD-10-CM | POA: Diagnosis not present

## 2012-11-17 DIAGNOSIS — N8500 Endometrial hyperplasia, unspecified: Secondary | ICD-10-CM | POA: Diagnosis not present

## 2012-11-21 ENCOUNTER — Ambulatory Visit
Admission: RE | Admit: 2012-11-21 | Discharge: 2012-11-21 | Disposition: A | Discharge status: Home / Self Care | Payer: Medicare Other | Source: Ambulatory Visit

## 2012-11-21 DIAGNOSIS — Z1231 Encounter for screening mammogram for malignant neoplasm of breast: Secondary | ICD-10-CM

## 2013-01-08 DIAGNOSIS — H2510 Age-related nuclear cataract, unspecified eye: Secondary | ICD-10-CM | POA: Diagnosis not present

## 2013-05-13 DIAGNOSIS — L719 Rosacea, unspecified: Secondary | ICD-10-CM | POA: Diagnosis not present

## 2013-05-13 DIAGNOSIS — L219 Seborrheic dermatitis, unspecified: Secondary | ICD-10-CM | POA: Diagnosis not present

## 2013-05-13 DIAGNOSIS — L659 Nonscarring hair loss, unspecified: Secondary | ICD-10-CM | POA: Diagnosis not present

## 2013-05-13 DIAGNOSIS — L570 Actinic keratosis: Secondary | ICD-10-CM | POA: Diagnosis not present

## 2013-06-10 DIAGNOSIS — Z130 Encounter for screening for diseases of the blood and blood-forming organs and certain disorders involving the immune mechanism: Secondary | ICD-10-CM | POA: Diagnosis not present

## 2013-06-10 DIAGNOSIS — Z124 Encounter for screening for malignant neoplasm of cervix: Secondary | ICD-10-CM | POA: Diagnosis not present

## 2013-11-20 ENCOUNTER — Other Ambulatory Visit: Payer: Self-pay

## 2013-11-20 DIAGNOSIS — Z1231 Encounter for screening mammogram for malignant neoplasm of breast: Secondary | ICD-10-CM

## 2013-12-01 ENCOUNTER — Ambulatory Visit
Admission: RE | Admit: 2013-12-01 | Discharge: 2013-12-01 | Disposition: A | Discharge status: Home / Self Care | Payer: Medicare Other | Source: Ambulatory Visit

## 2013-12-01 DIAGNOSIS — Z1231 Encounter for screening mammogram for malignant neoplasm of breast: Secondary | ICD-10-CM

## 2013-12-03 ENCOUNTER — Other Ambulatory Visit: Payer: Self-pay | Admitting: Obstetrics & Gynecology

## 2013-12-03 DIAGNOSIS — R928 Other abnormal and inconclusive findings on diagnostic imaging of breast: Secondary | ICD-10-CM

## 2013-12-09 DIAGNOSIS — N951 Menopausal and female climacteric states: Secondary | ICD-10-CM | POA: Diagnosis not present

## 2013-12-09 DIAGNOSIS — L659 Nonscarring hair loss, unspecified: Secondary | ICD-10-CM | POA: Diagnosis not present

## 2013-12-09 DIAGNOSIS — IMO0001 Reserved for inherently not codable concepts without codable children: Secondary | ICD-10-CM | POA: Diagnosis not present

## 2013-12-09 DIAGNOSIS — G4700 Insomnia, unspecified: Secondary | ICD-10-CM | POA: Diagnosis not present

## 2013-12-09 DIAGNOSIS — M7130 Other bursal cyst, unspecified site: Secondary | ICD-10-CM | POA: Diagnosis not present

## 2013-12-09 DIAGNOSIS — E039 Hypothyroidism, unspecified: Secondary | ICD-10-CM | POA: Diagnosis not present

## 2013-12-09 DIAGNOSIS — M25569 Pain in unspecified knee: Secondary | ICD-10-CM | POA: Diagnosis not present

## 2013-12-09 DIAGNOSIS — Z1331 Encounter for screening for depression: Secondary | ICD-10-CM | POA: Diagnosis not present

## 2013-12-10 DIAGNOSIS — E039 Hypothyroidism, unspecified: Secondary | ICD-10-CM | POA: Diagnosis not present

## 2013-12-11 ENCOUNTER — Ambulatory Visit
Admission: RE | Admit: 2013-12-11 | Discharge: 2013-12-11 | Disposition: A | Discharge status: Home / Self Care | Payer: Medicare Other | Source: Ambulatory Visit | Attending: Obstetrics & Gynecology | Admitting: Obstetrics & Gynecology

## 2013-12-11 DIAGNOSIS — R928 Other abnormal and inconclusive findings on diagnostic imaging of breast: Secondary | ICD-10-CM

## 2014-01-11 DIAGNOSIS — H00034 Abscess of left upper eyelid: Secondary | ICD-10-CM | POA: Diagnosis not present

## 2014-01-29 DIAGNOSIS — E039 Hypothyroidism, unspecified: Secondary | ICD-10-CM | POA: Diagnosis not present

## 2014-01-29 DIAGNOSIS — Z79899 Other long term (current) drug therapy: Secondary | ICD-10-CM | POA: Diagnosis not present

## 2014-01-29 DIAGNOSIS — Z008 Encounter for other general examination: Secondary | ICD-10-CM | POA: Diagnosis not present

## 2014-01-29 DIAGNOSIS — R5383 Other fatigue: Secondary | ICD-10-CM | POA: Diagnosis not present

## 2014-02-05 DIAGNOSIS — E039 Hypothyroidism, unspecified: Secondary | ICD-10-CM | POA: Diagnosis not present

## 2014-02-05 DIAGNOSIS — L659 Nonscarring hair loss, unspecified: Secondary | ICD-10-CM | POA: Diagnosis not present

## 2014-02-05 DIAGNOSIS — M797 Fibromyalgia: Secondary | ICD-10-CM | POA: Diagnosis not present

## 2014-02-05 DIAGNOSIS — H9319 Tinnitus, unspecified ear: Secondary | ICD-10-CM | POA: Diagnosis not present

## 2014-02-05 DIAGNOSIS — Z008 Encounter for other general examination: Secondary | ICD-10-CM | POA: Diagnosis not present

## 2014-02-05 DIAGNOSIS — N951 Menopausal and female climacteric states: Secondary | ICD-10-CM | POA: Diagnosis not present

## 2014-02-05 DIAGNOSIS — M25562 Pain in left knee: Secondary | ICD-10-CM | POA: Diagnosis not present

## 2014-02-05 DIAGNOSIS — R5383 Other fatigue: Secondary | ICD-10-CM | POA: Diagnosis not present

## 2014-04-12 DIAGNOSIS — H524 Presbyopia: Secondary | ICD-10-CM | POA: Diagnosis not present

## 2014-04-12 DIAGNOSIS — H2510 Age-related nuclear cataract, unspecified eye: Secondary | ICD-10-CM | POA: Diagnosis not present

## 2014-04-12 DIAGNOSIS — H5203 Hypermetropia, bilateral: Secondary | ICD-10-CM | POA: Diagnosis not present

## 2014-05-14 DIAGNOSIS — E039 Hypothyroidism, unspecified: Secondary | ICD-10-CM | POA: Diagnosis not present

## 2014-05-26 DIAGNOSIS — L718 Other rosacea: Secondary | ICD-10-CM | POA: Diagnosis not present

## 2014-05-26 DIAGNOSIS — L400 Psoriasis vulgaris: Secondary | ICD-10-CM | POA: Diagnosis not present

## 2014-06-16 DIAGNOSIS — L400 Psoriasis vulgaris: Secondary | ICD-10-CM | POA: Diagnosis not present

## 2014-06-16 DIAGNOSIS — D225 Melanocytic nevi of trunk: Secondary | ICD-10-CM | POA: Diagnosis not present

## 2014-06-16 DIAGNOSIS — L821 Other seborrheic keratosis: Secondary | ICD-10-CM | POA: Diagnosis not present

## 2014-06-16 DIAGNOSIS — L718 Other rosacea: Secondary | ICD-10-CM | POA: Diagnosis not present

## 2014-07-13 DIAGNOSIS — Z1389 Encounter for screening for other disorder: Secondary | ICD-10-CM | POA: Diagnosis not present

## 2014-07-13 DIAGNOSIS — Z6824 Body mass index (BMI) 24.0-24.9, adult: Secondary | ICD-10-CM | POA: Diagnosis not present

## 2014-07-13 DIAGNOSIS — E039 Hypothyroidism, unspecified: Secondary | ICD-10-CM | POA: Diagnosis not present

## 2014-08-17 DIAGNOSIS — Z01419 Encounter for gynecological examination (general) (routine) without abnormal findings: Secondary | ICD-10-CM | POA: Diagnosis not present

## 2014-08-17 DIAGNOSIS — Z6825 Body mass index (BMI) 25.0-25.9, adult: Secondary | ICD-10-CM | POA: Diagnosis not present

## 2014-09-06 DIAGNOSIS — E039 Hypothyroidism, unspecified: Secondary | ICD-10-CM | POA: Diagnosis not present

## 2014-09-29 DIAGNOSIS — K642 Third degree hemorrhoids: Secondary | ICD-10-CM | POA: Diagnosis not present

## 2014-10-13 DIAGNOSIS — K642 Third degree hemorrhoids: Secondary | ICD-10-CM | POA: Diagnosis not present

## 2014-11-01 DIAGNOSIS — E039 Hypothyroidism, unspecified: Secondary | ICD-10-CM | POA: Diagnosis not present

## 2014-11-03 DIAGNOSIS — K644 Residual hemorrhoidal skin tags: Secondary | ICD-10-CM | POA: Diagnosis not present

## 2014-11-03 DIAGNOSIS — K641 Second degree hemorrhoids: Secondary | ICD-10-CM | POA: Diagnosis not present

## 2014-11-24 DIAGNOSIS — K642 Third degree hemorrhoids: Secondary | ICD-10-CM | POA: Diagnosis not present

## 2014-12-20 ENCOUNTER — Ambulatory Visit (INDEPENDENT_AMBULATORY_CARE_PROVIDER_SITE_OTHER): Payer: Medicare Other | Admitting: Licensed Clinical Social Worker

## 2014-12-20 DIAGNOSIS — F419 Anxiety disorder, unspecified: Secondary | ICD-10-CM | POA: Diagnosis not present

## 2015-01-07 ENCOUNTER — Ambulatory Visit (INDEPENDENT_AMBULATORY_CARE_PROVIDER_SITE_OTHER): Payer: Medicare Other | Admitting: Licensed Clinical Social Worker

## 2015-01-07 DIAGNOSIS — F419 Anxiety disorder, unspecified: Secondary | ICD-10-CM

## 2015-01-19 DIAGNOSIS — R262 Difficulty in walking, not elsewhere classified: Secondary | ICD-10-CM | POA: Diagnosis not present

## 2015-01-19 DIAGNOSIS — M79672 Pain in left foot: Secondary | ICD-10-CM | POA: Diagnosis not present

## 2015-01-19 DIAGNOSIS — Z6824 Body mass index (BMI) 24.0-24.9, adult: Secondary | ICD-10-CM | POA: Diagnosis not present

## 2015-01-19 DIAGNOSIS — M25572 Pain in left ankle and joints of left foot: Secondary | ICD-10-CM | POA: Diagnosis not present

## 2015-01-28 ENCOUNTER — Ambulatory Visit (INDEPENDENT_AMBULATORY_CARE_PROVIDER_SITE_OTHER): Payer: Medicare Other | Admitting: Licensed Clinical Social Worker

## 2015-01-28 DIAGNOSIS — F419 Anxiety disorder, unspecified: Secondary | ICD-10-CM

## 2015-02-01 ENCOUNTER — Other Ambulatory Visit: Payer: Self-pay

## 2015-02-01 DIAGNOSIS — Z1231 Encounter for screening mammogram for malignant neoplasm of breast: Secondary | ICD-10-CM

## 2015-02-08 ENCOUNTER — Ambulatory Visit (INDEPENDENT_AMBULATORY_CARE_PROVIDER_SITE_OTHER): Payer: Medicare Other | Admitting: Podiatry

## 2015-02-08 ENCOUNTER — Encounter: Payer: Self-pay | Admitting: Podiatry

## 2015-02-08 ENCOUNTER — Ambulatory Visit: Payer: Medicare Other

## 2015-02-08 ENCOUNTER — Telehealth: Payer: Self-pay | Admitting: *Deleted

## 2015-02-08 VITALS — BP 137/76 | HR 62 | Resp 16

## 2015-02-08 DIAGNOSIS — M779 Enthesopathy, unspecified: Secondary | ICD-10-CM

## 2015-02-08 DIAGNOSIS — M19079 Primary osteoarthritis, unspecified ankle and foot: Secondary | ICD-10-CM

## 2015-02-08 DIAGNOSIS — G5792 Unspecified mononeuropathy of left lower limb: Secondary | ICD-10-CM

## 2015-02-08 DIAGNOSIS — M129 Arthropathy, unspecified: Secondary | ICD-10-CM | POA: Diagnosis not present

## 2015-02-08 NOTE — Progress Notes (Signed)
   Subjective:    Patient ID: Helen Gardner, female    DOB: 14-Jun-1945, 69 y.o.   MRN: [id number]  HPI: She presents today as a 69 year old white female with a chief complaint of pain swelling numbness and tingling that started July 2016 after wearing a sandal with a tight strap. She denies trauma. She had her primary care provider evaluate the foot and perform an radiograph which was read as negative. She states that she had the radiographs ready to bring to [place] today however she left those at home. She states that he provider were her with anti-inflammatories all to no avail.    Review of Systems  All other systems reviewed and are negative.      Objective:   Physical Exam: 69 year old white female in no apparent distress presents vital signs stable alert and oriented 3 pulses are strongly palpable. Neurologic sensorium is intact per Semmes-Weinstein monofilament. Deep tendon reflexes are intact muscle strength +5 over 5 dorsiflexion plantar flexors and inverters everters all intrinsic musculature is intact. Orthopedic evaluation demonstrates all joints distal to the ankle for range of motion without crepitus he does have some tenderness of Lisfranc's joint on frontal plane range of motion. She has thickening of the Lisfranc's joints on physical exam and she also has tenderness on palpation of the deep peroneal nerve on the left foot as opposed to the right foot. She also has a mild very small area of fluctuance underlying the deep peroneal nerve overlying the metatarsocuneiform joints. She refused additional radiographs today. Cutaneous evaluation of a straight supple well-hydrated cutis no erythema edema cellulitis drainage or odor.      Assessment & Plan:   assessment: Osteoarthritic changes mid foot left with possible ganglion dorsal aspect Lisfranc's joints left. Neuritis deep peroneal nerve left.  Plan: We discussed the etiology pathology conservative versus surgical therapies. I offered  her an injection of Kenalog which she declined. We discussed different options for shoe gear and how to lace up tennis shoes. At this point she would like to try physical therapy. She will notify us with any further issues.

## 2015-02-08 NOTE — Patient Instructions (Signed)
We have referred you to [facility] on [address] in [place]. You may call and set up your appointments at your convenience.  [address] [place], [place] [address] Phone: [phone] Fax: [phone]

## 2015-02-16 ENCOUNTER — Ambulatory Visit (INDEPENDENT_AMBULATORY_CARE_PROVIDER_SITE_OTHER): Payer: Medicare Other | Admitting: Licensed Clinical Social Worker

## 2015-02-16 DIAGNOSIS — F419 Anxiety disorder, unspecified: Secondary | ICD-10-CM

## 2015-03-09 ENCOUNTER — Ambulatory Visit: Payer: Self-pay

## 2015-03-11 ENCOUNTER — Ambulatory Visit
Admission: RE | Admit: 2015-03-11 | Discharge: 2015-03-11 | Disposition: A | Discharge status: Home / Self Care | Payer: Medicare Other | Source: Ambulatory Visit

## 2015-03-11 DIAGNOSIS — Z1231 Encounter for screening mammogram for malignant neoplasm of breast: Secondary | ICD-10-CM | POA: Diagnosis not present

## 2015-03-15 DIAGNOSIS — E039 Hypothyroidism, unspecified: Secondary | ICD-10-CM | POA: Diagnosis not present

## 2015-03-22 DIAGNOSIS — R8290 Unspecified abnormal findings in urine: Secondary | ICD-10-CM | POA: Diagnosis not present

## 2015-03-22 DIAGNOSIS — R5383 Other fatigue: Secondary | ICD-10-CM | POA: Diagnosis not present

## 2015-03-22 DIAGNOSIS — Z0000 Encounter for general adult medical examination without abnormal findings: Secondary | ICD-10-CM | POA: Diagnosis not present

## 2015-03-22 DIAGNOSIS — N390 Urinary tract infection, site not specified: Secondary | ICD-10-CM | POA: Diagnosis not present

## 2015-03-22 DIAGNOSIS — K644 Residual hemorrhoidal skin tags: Secondary | ICD-10-CM | POA: Diagnosis not present

## 2015-03-22 DIAGNOSIS — E038 Other specified hypothyroidism: Secondary | ICD-10-CM | POA: Diagnosis not present

## 2015-03-22 DIAGNOSIS — M797 Fibromyalgia: Secondary | ICD-10-CM | POA: Diagnosis not present

## 2015-03-22 DIAGNOSIS — L659 Nonscarring hair loss, unspecified: Secondary | ICD-10-CM | POA: Diagnosis not present

## 2015-03-22 DIAGNOSIS — Z6824 Body mass index (BMI) 24.0-24.9, adult: Secondary | ICD-10-CM | POA: Diagnosis not present

## 2015-03-22 DIAGNOSIS — Z1389 Encounter for screening for other disorder: Secondary | ICD-10-CM | POA: Diagnosis not present

## 2015-03-22 DIAGNOSIS — G4700 Insomnia, unspecified: Secondary | ICD-10-CM | POA: Diagnosis not present

## 2015-03-22 DIAGNOSIS — N951 Menopausal and female climacteric states: Secondary | ICD-10-CM | POA: Diagnosis not present

## 2015-03-28 NOTE — Telephone Encounter (Signed)
Entered in error

## 2015-04-05 ENCOUNTER — Ambulatory Visit (INDEPENDENT_AMBULATORY_CARE_PROVIDER_SITE_OTHER): Payer: Medicare Other | Admitting: Licensed Clinical Social Worker

## 2015-04-05 DIAGNOSIS — F419 Anxiety disorder, unspecified: Secondary | ICD-10-CM | POA: Diagnosis not present

## 2015-04-18 ENCOUNTER — Ambulatory Visit (INDEPENDENT_AMBULATORY_CARE_PROVIDER_SITE_OTHER): Payer: Medicare Other | Admitting: Licensed Clinical Social Worker

## 2015-04-18 DIAGNOSIS — F419 Anxiety disorder, unspecified: Secondary | ICD-10-CM | POA: Diagnosis not present

## 2015-05-02 ENCOUNTER — Ambulatory Visit (INDEPENDENT_AMBULATORY_CARE_PROVIDER_SITE_OTHER): Payer: Medicare Other | Admitting: Licensed Clinical Social Worker

## 2015-05-02 DIAGNOSIS — F419 Anxiety disorder, unspecified: Secondary | ICD-10-CM

## 2015-05-18 DIAGNOSIS — E038 Other specified hypothyroidism: Secondary | ICD-10-CM | POA: Diagnosis not present

## 2015-05-24 ENCOUNTER — Ambulatory Visit (INDEPENDENT_AMBULATORY_CARE_PROVIDER_SITE_OTHER): Payer: Medicare Other | Admitting: Licensed Clinical Social Worker

## 2015-05-24 DIAGNOSIS — F419 Anxiety disorder, unspecified: Secondary | ICD-10-CM | POA: Diagnosis not present

## 2015-06-07 ENCOUNTER — Ambulatory Visit (INDEPENDENT_AMBULATORY_CARE_PROVIDER_SITE_OTHER): Payer: Medicare Other | Admitting: Licensed Clinical Social Worker

## 2015-06-07 DIAGNOSIS — F419 Anxiety disorder, unspecified: Secondary | ICD-10-CM | POA: Diagnosis not present

## 2015-06-21 ENCOUNTER — Ambulatory Visit (INDEPENDENT_AMBULATORY_CARE_PROVIDER_SITE_OTHER): Payer: Medicare Other | Admitting: Licensed Clinical Social Worker

## 2015-06-21 DIAGNOSIS — F419 Anxiety disorder, unspecified: Secondary | ICD-10-CM | POA: Diagnosis not present

## 2015-06-30 DIAGNOSIS — D2272 Melanocytic nevi of left lower limb, including hip: Secondary | ICD-10-CM | POA: Diagnosis not present

## 2015-06-30 DIAGNOSIS — L718 Other rosacea: Secondary | ICD-10-CM | POA: Diagnosis not present

## 2015-06-30 DIAGNOSIS — L821 Other seborrheic keratosis: Secondary | ICD-10-CM | POA: Diagnosis not present

## 2015-06-30 DIAGNOSIS — D225 Melanocytic nevi of trunk: Secondary | ICD-10-CM | POA: Diagnosis not present

## 2015-06-30 DIAGNOSIS — D2261 Melanocytic nevi of right upper limb, including shoulder: Secondary | ICD-10-CM | POA: Diagnosis not present

## 2015-06-30 DIAGNOSIS — L570 Actinic keratosis: Secondary | ICD-10-CM | POA: Diagnosis not present

## 2015-06-30 DIAGNOSIS — D2262 Melanocytic nevi of left upper limb, including shoulder: Secondary | ICD-10-CM | POA: Diagnosis not present

## 2015-06-30 DIAGNOSIS — L218 Other seborrheic dermatitis: Secondary | ICD-10-CM | POA: Diagnosis not present

## 2015-06-30 DIAGNOSIS — D2271 Melanocytic nevi of right lower limb, including hip: Secondary | ICD-10-CM | POA: Diagnosis not present

## 2015-08-16 DIAGNOSIS — H2513 Age-related nuclear cataract, bilateral: Secondary | ICD-10-CM | POA: Diagnosis not present

## 2015-08-18 DIAGNOSIS — Z124 Encounter for screening for malignant neoplasm of cervix: Secondary | ICD-10-CM | POA: Diagnosis not present

## 2015-08-18 DIAGNOSIS — Z01419 Encounter for gynecological examination (general) (routine) without abnormal findings: Secondary | ICD-10-CM | POA: Diagnosis not present

## 2015-08-18 DIAGNOSIS — Z6824 Body mass index (BMI) 24.0-24.9, adult: Secondary | ICD-10-CM | POA: Diagnosis not present

## 2015-08-18 DIAGNOSIS — Z1272 Encounter for screening for malignant neoplasm of vagina: Secondary | ICD-10-CM | POA: Diagnosis not present

## 2015-09-06 ENCOUNTER — Ambulatory Visit: Payer: Medicare Other | Admitting: Licensed Clinical Social Worker

## 2015-10-05 DIAGNOSIS — Z6823 Body mass index (BMI) 23.0-23.9, adult: Secondary | ICD-10-CM | POA: Diagnosis not present

## 2015-10-05 DIAGNOSIS — J302 Other seasonal allergic rhinitis: Secondary | ICD-10-CM | POA: Diagnosis not present

## 2015-10-05 DIAGNOSIS — S30861A Insect bite (nonvenomous) of abdominal wall, initial encounter: Secondary | ICD-10-CM | POA: Diagnosis not present

## 2016-01-10 ENCOUNTER — Ambulatory Visit (INDEPENDENT_AMBULATORY_CARE_PROVIDER_SITE_OTHER): Payer: Medicare Other | Admitting: Licensed Clinical Social Worker

## 2016-01-10 DIAGNOSIS — F419 Anxiety disorder, unspecified: Secondary | ICD-10-CM

## 2016-01-25 ENCOUNTER — Ambulatory Visit (INDEPENDENT_AMBULATORY_CARE_PROVIDER_SITE_OTHER): Payer: Medicare Other | Admitting: Licensed Clinical Social Worker

## 2016-01-25 DIAGNOSIS — F419 Anxiety disorder, unspecified: Secondary | ICD-10-CM | POA: Diagnosis not present

## 2016-02-14 ENCOUNTER — Ambulatory Visit: Payer: Medicare Other | Admitting: Licensed Clinical Social Worker

## 2016-04-03 DIAGNOSIS — E038 Other specified hypothyroidism: Secondary | ICD-10-CM | POA: Diagnosis not present

## 2016-04-09 DIAGNOSIS — Z1389 Encounter for screening for other disorder: Secondary | ICD-10-CM | POA: Diagnosis not present

## 2016-04-09 DIAGNOSIS — M797 Fibromyalgia: Secondary | ICD-10-CM | POA: Diagnosis not present

## 2016-04-09 DIAGNOSIS — E784 Other hyperlipidemia: Secondary | ICD-10-CM | POA: Diagnosis not present

## 2016-04-09 DIAGNOSIS — E878 Other disorders of electrolyte and fluid balance, not elsewhere classified: Secondary | ICD-10-CM | POA: Diagnosis not present

## 2016-04-09 DIAGNOSIS — Z0000 Encounter for general adult medical examination without abnormal findings: Secondary | ICD-10-CM | POA: Diagnosis not present

## 2016-04-09 DIAGNOSIS — J309 Allergic rhinitis, unspecified: Secondary | ICD-10-CM | POA: Diagnosis not present

## 2016-04-09 DIAGNOSIS — Z6823 Body mass index (BMI) 23.0-23.9, adult: Secondary | ICD-10-CM | POA: Diagnosis not present

## 2016-04-09 DIAGNOSIS — E038 Other specified hypothyroidism: Secondary | ICD-10-CM | POA: Diagnosis not present

## 2016-04-30 ENCOUNTER — Other Ambulatory Visit: Payer: Self-pay | Admitting: Obstetrics & Gynecology

## 2016-04-30 DIAGNOSIS — Z1231 Encounter for screening mammogram for malignant neoplasm of breast: Secondary | ICD-10-CM

## 2016-05-25 ENCOUNTER — Ambulatory Visit
Admission: RE | Admit: 2016-05-25 | Discharge: 2016-05-25 | Disposition: A | Discharge status: Home / Self Care | Payer: Medicare Other | Source: Ambulatory Visit | Attending: Obstetrics & Gynecology | Admitting: Obstetrics & Gynecology

## 2016-05-25 DIAGNOSIS — Z1231 Encounter for screening mammogram for malignant neoplasm of breast: Secondary | ICD-10-CM | POA: Diagnosis not present

## 2016-05-29 ENCOUNTER — Other Ambulatory Visit: Payer: Self-pay | Admitting: Obstetrics & Gynecology

## 2016-05-29 DIAGNOSIS — R928 Other abnormal and inconclusive findings on diagnostic imaging of breast: Secondary | ICD-10-CM

## 2016-05-31 ENCOUNTER — Ambulatory Visit
Admission: RE | Admit: 2016-05-31 | Discharge: 2016-05-31 | Disposition: A | Discharge status: Home / Self Care | Payer: Medicare Other | Source: Ambulatory Visit | Attending: Obstetrics & Gynecology | Admitting: Obstetrics & Gynecology

## 2016-05-31 DIAGNOSIS — R928 Other abnormal and inconclusive findings on diagnostic imaging of breast: Secondary | ICD-10-CM

## 2016-05-31 DIAGNOSIS — N6002 Solitary cyst of left breast: Secondary | ICD-10-CM | POA: Diagnosis not present

## 2016-07-10 DIAGNOSIS — D2271 Melanocytic nevi of right lower limb, including hip: Secondary | ICD-10-CM | POA: Diagnosis not present

## 2016-07-10 DIAGNOSIS — L718 Other rosacea: Secondary | ICD-10-CM | POA: Diagnosis not present

## 2016-07-10 DIAGNOSIS — D2272 Melanocytic nevi of left lower limb, including hip: Secondary | ICD-10-CM | POA: Diagnosis not present

## 2016-07-10 DIAGNOSIS — D225 Melanocytic nevi of trunk: Secondary | ICD-10-CM | POA: Diagnosis not present

## 2016-07-10 DIAGNOSIS — L218 Other seborrheic dermatitis: Secondary | ICD-10-CM | POA: Diagnosis not present

## 2016-07-10 DIAGNOSIS — D485 Neoplasm of uncertain behavior of skin: Secondary | ICD-10-CM | POA: Diagnosis not present

## 2016-07-10 DIAGNOSIS — L821 Other seborrheic keratosis: Secondary | ICD-10-CM | POA: Diagnosis not present

## 2016-07-10 DIAGNOSIS — D2261 Melanocytic nevi of right upper limb, including shoulder: Secondary | ICD-10-CM | POA: Diagnosis not present

## 2016-07-10 DIAGNOSIS — L565 Disseminated superficial actinic porokeratosis (DSAP): Secondary | ICD-10-CM | POA: Diagnosis not present

## 2016-07-10 DIAGNOSIS — L570 Actinic keratosis: Secondary | ICD-10-CM | POA: Diagnosis not present

## 2016-07-10 DIAGNOSIS — D2262 Melanocytic nevi of left upper limb, including shoulder: Secondary | ICD-10-CM | POA: Diagnosis not present

## 2016-07-10 DIAGNOSIS — D224 Melanocytic nevi of scalp and neck: Secondary | ICD-10-CM | POA: Diagnosis not present

## 2016-08-30 DIAGNOSIS — Z01419 Encounter for gynecological examination (general) (routine) without abnormal findings: Secondary | ICD-10-CM | POA: Diagnosis not present

## 2016-08-30 DIAGNOSIS — Z6824 Body mass index (BMI) 24.0-24.9, adult: Secondary | ICD-10-CM | POA: Diagnosis not present

## 2017-04-22 DIAGNOSIS — R82998 Other abnormal findings in urine: Secondary | ICD-10-CM | POA: Diagnosis not present

## 2017-04-22 DIAGNOSIS — E7849 Other hyperlipidemia: Secondary | ICD-10-CM | POA: Diagnosis not present

## 2017-04-22 DIAGNOSIS — E038 Other specified hypothyroidism: Secondary | ICD-10-CM | POA: Diagnosis not present

## 2017-04-22 DIAGNOSIS — Z780 Asymptomatic menopausal state: Secondary | ICD-10-CM | POA: Diagnosis not present

## 2017-04-29 DIAGNOSIS — K644 Residual hemorrhoidal skin tags: Secondary | ICD-10-CM | POA: Diagnosis not present

## 2017-04-29 DIAGNOSIS — Z6824 Body mass index (BMI) 24.0-24.9, adult: Secondary | ICD-10-CM | POA: Diagnosis not present

## 2017-04-29 DIAGNOSIS — M25512 Pain in left shoulder: Secondary | ICD-10-CM | POA: Diagnosis not present

## 2017-04-29 DIAGNOSIS — R05 Cough: Secondary | ICD-10-CM | POA: Diagnosis not present

## 2017-04-29 DIAGNOSIS — N951 Menopausal and female climacteric states: Secondary | ICD-10-CM | POA: Diagnosis not present

## 2017-04-29 DIAGNOSIS — Z0000 Encounter for general adult medical examination without abnormal findings: Secondary | ICD-10-CM | POA: Diagnosis not present

## 2017-04-29 DIAGNOSIS — Z1389 Encounter for screening for other disorder: Secondary | ICD-10-CM | POA: Diagnosis not present

## 2017-04-29 DIAGNOSIS — E7849 Other hyperlipidemia: Secondary | ICD-10-CM | POA: Diagnosis not present

## 2017-04-29 DIAGNOSIS — F438 Other reactions to severe stress: Secondary | ICD-10-CM | POA: Diagnosis not present

## 2017-04-29 DIAGNOSIS — M25511 Pain in right shoulder: Secondary | ICD-10-CM | POA: Diagnosis not present

## 2017-04-29 DIAGNOSIS — J3089 Other allergic rhinitis: Secondary | ICD-10-CM | POA: Diagnosis not present

## 2017-04-29 DIAGNOSIS — E038 Other specified hypothyroidism: Secondary | ICD-10-CM | POA: Diagnosis not present

## 2017-05-09 ENCOUNTER — Other Ambulatory Visit: Payer: Self-pay | Admitting: Obstetrics & Gynecology

## 2017-05-09 DIAGNOSIS — Z1231 Encounter for screening mammogram for malignant neoplasm of breast: Secondary | ICD-10-CM

## 2017-06-03 ENCOUNTER — Ambulatory Visit
Admission: RE | Admit: 2017-06-03 | Discharge: 2017-06-03 | Disposition: A | Discharge status: Home / Self Care | Payer: Medicare Other | Source: Ambulatory Visit | Attending: Obstetrics & Gynecology | Admitting: Obstetrics & Gynecology

## 2017-06-03 DIAGNOSIS — Z1231 Encounter for screening mammogram for malignant neoplasm of breast: Secondary | ICD-10-CM

## 2017-06-18 DIAGNOSIS — E039 Hypothyroidism, unspecified: Secondary | ICD-10-CM | POA: Diagnosis not present

## 2017-07-01 DIAGNOSIS — N950 Postmenopausal bleeding: Secondary | ICD-10-CM | POA: Diagnosis not present

## 2017-07-30 DIAGNOSIS — N950 Postmenopausal bleeding: Secondary | ICD-10-CM | POA: Diagnosis not present

## 2017-08-09 DIAGNOSIS — E038 Other specified hypothyroidism: Secondary | ICD-10-CM | POA: Diagnosis not present

## 2017-08-14 ENCOUNTER — Other Ambulatory Visit: Payer: Self-pay | Admitting: Obstetrics & Gynecology

## 2017-08-14 DIAGNOSIS — N840 Polyp of corpus uteri: Secondary | ICD-10-CM | POA: Diagnosis not present

## 2017-08-14 DIAGNOSIS — N950 Postmenopausal bleeding: Secondary | ICD-10-CM | POA: Diagnosis not present

## 2017-08-14 DIAGNOSIS — N8500 Endometrial hyperplasia, unspecified: Secondary | ICD-10-CM | POA: Diagnosis not present

## 2017-08-14 DIAGNOSIS — N8501 Benign endometrial hyperplasia: Secondary | ICD-10-CM | POA: Diagnosis not present

## 2017-08-28 DIAGNOSIS — D2262 Melanocytic nevi of left upper limb, including shoulder: Secondary | ICD-10-CM | POA: Diagnosis not present

## 2017-08-28 DIAGNOSIS — D485 Neoplasm of uncertain behavior of skin: Secondary | ICD-10-CM | POA: Diagnosis not present

## 2017-08-28 DIAGNOSIS — L570 Actinic keratosis: Secondary | ICD-10-CM | POA: Diagnosis not present

## 2017-08-28 DIAGNOSIS — D2261 Melanocytic nevi of right upper limb, including shoulder: Secondary | ICD-10-CM | POA: Diagnosis not present

## 2017-08-28 DIAGNOSIS — D225 Melanocytic nevi of trunk: Secondary | ICD-10-CM | POA: Diagnosis not present

## 2017-09-02 DIAGNOSIS — Z01419 Encounter for gynecological examination (general) (routine) without abnormal findings: Secondary | ICD-10-CM | POA: Diagnosis not present

## 2017-09-02 DIAGNOSIS — Z1231 Encounter for screening mammogram for malignant neoplasm of breast: Secondary | ICD-10-CM | POA: Diagnosis not present

## 2017-09-02 DIAGNOSIS — Z124 Encounter for screening for malignant neoplasm of cervix: Secondary | ICD-10-CM | POA: Diagnosis not present

## 2017-09-02 DIAGNOSIS — Z6825 Body mass index (BMI) 25.0-25.9, adult: Secondary | ICD-10-CM | POA: Diagnosis not present

## 2017-10-24 DIAGNOSIS — H00022 Hordeolum internum right lower eyelid: Secondary | ICD-10-CM | POA: Diagnosis not present

## 2018-04-28 DIAGNOSIS — E7849 Other hyperlipidemia: Secondary | ICD-10-CM | POA: Diagnosis not present

## 2018-04-28 DIAGNOSIS — E038 Other specified hypothyroidism: Secondary | ICD-10-CM | POA: Diagnosis not present

## 2018-05-05 DIAGNOSIS — G4709 Other insomnia: Secondary | ICD-10-CM | POA: Diagnosis not present

## 2018-05-05 DIAGNOSIS — E038 Other specified hypothyroidism: Secondary | ICD-10-CM | POA: Diagnosis not present

## 2018-05-05 DIAGNOSIS — R82998 Other abnormal findings in urine: Secondary | ICD-10-CM | POA: Diagnosis not present

## 2018-05-05 DIAGNOSIS — M25551 Pain in right hip: Secondary | ICD-10-CM | POA: Diagnosis not present

## 2018-05-05 DIAGNOSIS — Z1331 Encounter for screening for depression: Secondary | ICD-10-CM | POA: Diagnosis not present

## 2018-05-05 DIAGNOSIS — J3089 Other allergic rhinitis: Secondary | ICD-10-CM | POA: Diagnosis not present

## 2018-05-05 DIAGNOSIS — Z0000 Encounter for general adult medical examination without abnormal findings: Secondary | ICD-10-CM | POA: Diagnosis not present

## 2018-05-05 DIAGNOSIS — Z1339 Encounter for screening examination for other mental health and behavioral disorders: Secondary | ICD-10-CM | POA: Diagnosis not present

## 2018-05-05 DIAGNOSIS — Z6825 Body mass index (BMI) 25.0-25.9, adult: Secondary | ICD-10-CM | POA: Diagnosis not present

## 2018-05-05 DIAGNOSIS — M25511 Pain in right shoulder: Secondary | ICD-10-CM | POA: Diagnosis not present

## 2018-05-05 DIAGNOSIS — E7849 Other hyperlipidemia: Secondary | ICD-10-CM | POA: Diagnosis not present

## 2018-05-05 DIAGNOSIS — M797 Fibromyalgia: Secondary | ICD-10-CM | POA: Diagnosis not present

## 2018-05-05 DIAGNOSIS — K644 Residual hemorrhoidal skin tags: Secondary | ICD-10-CM | POA: Diagnosis not present

## 2018-07-16 ENCOUNTER — Other Ambulatory Visit: Payer: Self-pay | Admitting: Obstetrics & Gynecology

## 2018-07-16 DIAGNOSIS — Z1231 Encounter for screening mammogram for malignant neoplasm of breast: Secondary | ICD-10-CM

## 2018-08-20 ENCOUNTER — Other Ambulatory Visit: Payer: Self-pay | Admitting: Internal Medicine

## 2018-08-21 ENCOUNTER — Other Ambulatory Visit: Payer: Self-pay | Admitting: Internal Medicine

## 2018-08-21 DIAGNOSIS — Z0000 Encounter for general adult medical examination without abnormal findings: Secondary | ICD-10-CM

## 2018-08-21 DIAGNOSIS — E785 Hyperlipidemia, unspecified: Secondary | ICD-10-CM

## 2018-08-29 ENCOUNTER — Ambulatory Visit
Admission: RE | Admit: 2018-08-29 | Discharge: 2018-08-29 | Disposition: A | Discharge status: Home / Self Care | Payer: Medicare Other | Source: Ambulatory Visit | Attending: Internal Medicine | Admitting: Internal Medicine

## 2018-08-29 ENCOUNTER — Other Ambulatory Visit: Payer: Self-pay

## 2018-08-29 DIAGNOSIS — E785 Hyperlipidemia, unspecified: Secondary | ICD-10-CM | POA: Diagnosis not present

## 2018-08-29 DIAGNOSIS — Z0000 Encounter for general adult medical examination without abnormal findings: Secondary | ICD-10-CM

## 2018-09-18 ENCOUNTER — Other Ambulatory Visit: Payer: Self-pay

## 2018-09-18 ENCOUNTER — Ambulatory Visit
Admission: RE | Admit: 2018-09-18 | Discharge: 2018-09-18 | Disposition: A | Discharge status: Home / Self Care | Payer: Medicare Other | Source: Ambulatory Visit | Attending: Obstetrics & Gynecology | Admitting: Obstetrics & Gynecology

## 2018-09-18 DIAGNOSIS — Z1231 Encounter for screening mammogram for malignant neoplasm of breast: Secondary | ICD-10-CM

## 2018-10-08 DIAGNOSIS — D2261 Melanocytic nevi of right upper limb, including shoulder: Secondary | ICD-10-CM | POA: Diagnosis not present

## 2018-10-08 DIAGNOSIS — I788 Other diseases of capillaries: Secondary | ICD-10-CM | POA: Diagnosis not present

## 2018-10-08 DIAGNOSIS — L821 Other seborrheic keratosis: Secondary | ICD-10-CM | POA: Diagnosis not present

## 2018-10-08 DIAGNOSIS — D2271 Melanocytic nevi of right lower limb, including hip: Secondary | ICD-10-CM | POA: Diagnosis not present

## 2018-10-08 DIAGNOSIS — D2262 Melanocytic nevi of left upper limb, including shoulder: Secondary | ICD-10-CM | POA: Diagnosis not present

## 2018-10-08 DIAGNOSIS — D225 Melanocytic nevi of trunk: Secondary | ICD-10-CM | POA: Diagnosis not present

## 2018-10-08 DIAGNOSIS — L718 Other rosacea: Secondary | ICD-10-CM | POA: Diagnosis not present

## 2018-10-08 DIAGNOSIS — D2272 Melanocytic nevi of left lower limb, including hip: Secondary | ICD-10-CM | POA: Diagnosis not present

## 2018-10-08 DIAGNOSIS — L400 Psoriasis vulgaris: Secondary | ICD-10-CM | POA: Diagnosis not present

## 2018-10-20 DIAGNOSIS — Z6826 Body mass index (BMI) 26.0-26.9, adult: Secondary | ICD-10-CM | POA: Diagnosis not present

## 2018-10-20 DIAGNOSIS — Z124 Encounter for screening for malignant neoplasm of cervix: Secondary | ICD-10-CM | POA: Diagnosis not present

## 2019-01-10 DIAGNOSIS — Z23 Encounter for immunization: Secondary | ICD-10-CM | POA: Diagnosis not present

## 2019-04-03 HISTORY — PX: COLONOSCOPY: SHX174

## 2019-04-03 HISTORY — PX: POLYPECTOMY: SHX149

## 2019-04-21 ENCOUNTER — Ambulatory Visit: Payer: Medicare Other | Attending: Internal Medicine

## 2019-04-21 DIAGNOSIS — Z23 Encounter for immunization: Secondary | ICD-10-CM | POA: Diagnosis not present

## 2019-04-21 NOTE — Progress Notes (Signed)
   Covid-19 Vaccination Clinic  Name:  Helen Gardner    MRN: [id number] DOB: 09-26-1945  04/21/2019  [name] was observed post Covid-19 immunization for 15 minutes without incidence. She was provided with Vaccine Information Sheet and instruction to access the V-Safe system.   [name] was instructed to call 911 with any severe reactions post vaccine: Marland Kitchen Difficulty breathing  . Swelling of your face and throat  . A fast heartbeat  . A bad rash all over your body  . Dizziness and weakness    Immunizations Administered    Name Date Dose VIS Date Route   Pfizer COVID-19 Vaccine 04/21/2019  2:52 PM 0.3 mL 03/13/2019 Intramuscular   Manufacturer: [place]   Lot: [id number]   [place]: [id number]

## 2019-05-05 DIAGNOSIS — E7849 Other hyperlipidemia: Secondary | ICD-10-CM | POA: Diagnosis not present

## 2019-05-05 DIAGNOSIS — E038 Other specified hypothyroidism: Secondary | ICD-10-CM | POA: Diagnosis not present

## 2019-05-05 DIAGNOSIS — R82998 Other abnormal findings in urine: Secondary | ICD-10-CM | POA: Diagnosis not present

## 2019-05-07 ENCOUNTER — Ambulatory Visit: Payer: Medicare Other

## 2019-05-11 ENCOUNTER — Ambulatory Visit: Payer: Medicare Other | Attending: Internal Medicine

## 2019-05-11 DIAGNOSIS — Z23 Encounter for immunization: Secondary | ICD-10-CM | POA: Insufficient documentation

## 2019-05-11 NOTE — Progress Notes (Signed)
   Covid-19 Vaccination Clinic  Name:  Helen Gardner    MRN: [id number] DOB: 10/15/1945  05/11/2019  [name] was observed post Covid-19 immunization for 15 minutes without incidence. She was provided with Vaccine Information Sheet and instruction to access the V-Safe system.   [name] was instructed to call 911 with any severe reactions post vaccine: Marland Kitchen Difficulty breathing  . Swelling of your face and throat  . A fast heartbeat  . A bad rash all over your body  . Dizziness and weakness    Immunizations Administered    Name Date Dose VIS Date Route   Pfizer COVID-19 Vaccine 05/11/2019  4:05 PM 0.3 mL 03/13/2019 Intramuscular   Manufacturer: [place]   Lot: [id number]   [place]: [id number]

## 2019-05-12 ENCOUNTER — Telehealth: Payer: Self-pay | Admitting: Gastroenterology

## 2019-05-12 DIAGNOSIS — Z1331 Encounter for screening for depression: Secondary | ICD-10-CM | POA: Diagnosis not present

## 2019-05-12 DIAGNOSIS — E039 Hypothyroidism, unspecified: Secondary | ICD-10-CM | POA: Diagnosis not present

## 2019-05-12 DIAGNOSIS — E785 Hyperlipidemia, unspecified: Secondary | ICD-10-CM | POA: Diagnosis not present

## 2019-05-12 DIAGNOSIS — Z0000 Encounter for general adult medical examination without abnormal findings: Secondary | ICD-10-CM | POA: Diagnosis not present

## 2019-05-12 DIAGNOSIS — R5383 Other fatigue: Secondary | ICD-10-CM | POA: Diagnosis not present

## 2019-05-12 DIAGNOSIS — M797 Fibromyalgia: Secondary | ICD-10-CM | POA: Diagnosis not present

## 2019-05-12 DIAGNOSIS — G4700 Insomnia, unspecified: Secondary | ICD-10-CM | POA: Diagnosis not present

## 2019-05-12 NOTE — Telephone Encounter (Signed)
DOD 05/12/19 Dr. Rush Landmark, this pt was referred for a colonoscopy by Dr. Ardeth Perfect.  Pt has GI hx at [facility]--previous colonoscopy in 2013.  Records will be sent to you for review.  Please advise scheduling.

## 2019-06-02 ENCOUNTER — Encounter: Payer: Self-pay | Admitting: Gastroenterology

## 2019-06-08 ENCOUNTER — Other Ambulatory Visit: Payer: Self-pay

## 2019-06-08 ENCOUNTER — Ambulatory Visit (AMBULATORY_SURGERY_CENTER): Payer: Self-pay | Admitting: *Deleted

## 2019-06-08 VITALS — Temp 97.7°F | Ht 67.0 in | Wt 167.0 lb

## 2019-06-08 DIAGNOSIS — Z800 Family history of malignant neoplasm of digestive organs: Secondary | ICD-10-CM

## 2019-06-08 DIAGNOSIS — Z8371 Family history of colonic polyps: Secondary | ICD-10-CM

## 2019-06-08 NOTE — Progress Notes (Signed)
Patient is here in-person for PV. Patient denies any allergies to eggs or soy. Patient denies any problems with anesthesia/sedation. Patient denies any oxygen use at home. Patient denies taking any diet/weight loss medications or blood thinners. Patient is not being treated for MRSA or C-diff. EMMI education assisgned to the patient for the procedure, this was explained and instructions given to patient. COVID-19 screening test is not needed both covid vaccines completed per pt, 2nd dose on 05/11/2019. Patient is aware of our care-partner policy and will wear a mask into building.    Patient is having IBS symptoms and is seeing blood with BM's, pt will talk with Dr.Mansouraty about this the day of the colonoscopy.

## 2019-06-23 ENCOUNTER — Encounter: Payer: Self-pay | Admitting: Gastroenterology

## 2019-06-23 ENCOUNTER — Ambulatory Visit (AMBULATORY_SURGERY_CENTER): Payer: Medicare Other | Admitting: Gastroenterology

## 2019-06-23 ENCOUNTER — Other Ambulatory Visit: Payer: Self-pay

## 2019-06-23 VITALS — BP 113/37 | HR 76 | Temp 96.8°F | Resp 12 | Ht 67.0 in | Wt 167.0 lb

## 2019-06-23 DIAGNOSIS — D123 Benign neoplasm of transverse colon: Secondary | ICD-10-CM

## 2019-06-23 DIAGNOSIS — Z800 Family history of malignant neoplasm of digestive organs: Secondary | ICD-10-CM | POA: Diagnosis not present

## 2019-06-23 DIAGNOSIS — D120 Benign neoplasm of cecum: Secondary | ICD-10-CM

## 2019-06-23 DIAGNOSIS — K635 Polyp of colon: Secondary | ICD-10-CM | POA: Diagnosis not present

## 2019-06-23 DIAGNOSIS — K649 Unspecified hemorrhoids: Secondary | ICD-10-CM

## 2019-06-23 DIAGNOSIS — Z8371 Family history of colonic polyps: Secondary | ICD-10-CM

## 2019-06-23 DIAGNOSIS — Z1211 Encounter for screening for malignant neoplasm of colon: Secondary | ICD-10-CM

## 2019-06-23 DIAGNOSIS — D127 Benign neoplasm of rectosigmoid junction: Secondary | ICD-10-CM

## 2019-06-23 MED ORDER — HYDROCORTISONE ACETATE 25 MG RE SUPP: 25.0000 mg | Freq: Two times a day (BID) | RECTAL | 0 refills | Status: DC

## 2019-06-23 MED ORDER — SODIUM CHLORIDE 0.9 % IV SOLN: 500.0000 mL | Freq: Once | INTRAVENOUS | Status: DC

## 2019-06-23 NOTE — Progress Notes (Signed)
Called to room to assist during endoscopic procedure.  Patient ID and intended procedure confirmed with present staff. Received instructions for my participation in the procedure from the performing physician.  

## 2019-06-23 NOTE — Progress Notes (Signed)
Pt's states no medical or surgical changes since previsit or office visit. 

## 2019-06-23 NOTE — Progress Notes (Signed)
pt tolerated well. VSS. awake and to recovery. Report given to RN.  

## 2019-06-23 NOTE — Patient Instructions (Signed)
Handouts on polyps, hemorrhoids, diverticulosis, and high fiber diet given to you today  °Await pathology results  ° ° °YOU HAD AN ENDOSCOPIC PROCEDURE TODAY AT THE [facility]:   Refer to the procedure report that was given to you for any specific questions about what was found during the examination.  If the procedure report does not answer your questions, please call your gastroenterologist to clarify.  If you requested that your care partner not be given the details of your procedure findings, then the procedure report has been included in a sealed envelope for you to review at your convenience later. ° °YOU SHOULD EXPECT: Some feelings of bloating in the abdomen. Passage of more gas than usual.  Walking can help get rid of the air that was put into your GI tract during the procedure and reduce the bloating. If you had a lower endoscopy (such as a colonoscopy or flexible sigmoidoscopy) you may notice spotting of blood in your stool or on the toilet paper. If you underwent a bowel prep for your procedure, you may not have a normal bowel movement for a few days. ° °Please Note:  You might notice some irritation and congestion in your nose or some drainage.  This is from the oxygen used during your procedure.  There is no need for concern and it should clear up in a day or so. ° °SYMPTOMS TO REPORT IMMEDIATELY: ° °Following lower endoscopy (colonoscopy or flexible sigmoidoscopy): ° Excessive amounts of blood in the stool ° Significant tenderness or worsening of abdominal pains ° Swelling of the abdomen that is new, acute ° Fever of 100°F or higher ° °For urgent or emergent issues, a gastroenterologist can be reached at any hour by calling [phone]. °Do not use MyChart messaging for urgent concerns.  ° ° °DIET:  We do recommend a small meal at first, but then you may proceed to your regular diet.  Drink plenty of fluids but you should avoid alcoholic beverages for 24 hours. ° °ACTIVITY:  You  should plan to take it easy for the rest of today and you should NOT DRIVE or use heavy machinery until tomorrow (because of the sedation medicines used during the test).   ° °FOLLOW UP: °Our staff will call the number listed on your records 48-72 hours following your procedure to check on you and address any questions or concerns that you may have regarding the information given to you following your procedure. If we do not reach you, we will leave a message.  We will attempt to reach you two times.  During this call, we will ask if you have developed any symptoms of COVID 19. If you develop any symptoms (ie: fever, flu-like symptoms, shortness of breath, cough etc.) before then, please call [phone].  If you test positive for Covid 19 in the 2 weeks post procedure, please call and report this information to us.   ° °If any biopsies were taken you will be contacted by phone or by letter within the next 1-3 weeks.  Please call us at [phone] if you have not heard about the biopsies in 3 weeks.  ° ° °SIGNATURES/CONFIDENTIALITY: °You and/or your care partner have signed paperwork which will be entered into your electronic medical record.  These signatures attest to the fact that that the information above on your After Visit Summary has been reviewed and is understood.  Full responsibility of the confidentiality of this discharge information lies with you and/or your care-partner.  °  your care-partner. 

## 2019-06-23 NOTE — Op Note (Signed)
[facility] Patient Name: Helen Gardner Procedure Date: 06/23/2019 1:29 PM MRN: [id number] Endoscopist: Justice Britain , MD Age: 74 Referring MD:  Date of Birth: 11-Aug-1945 Gender: Female Account #: [id number] Procedure:                Colonoscopy Indications:              Screening in patient at increased risk: Family                            history of 1st-degree relative with colorectal                            cancer before age 35 years, Brother with History of                            Colon Polyps Medicines:                Monitored Anesthesia Care Procedure:                Pre-Anesthesia Assessment:                           - Prior to the procedure, a History and Physical                            was performed, and patient medications and                            allergies were reviewed. The patient's tolerance of                            previous anesthesia was also reviewed. The risks                            and benefits of the procedure and the sedation                            options and risks were discussed with the patient.                            All questions were answered, and informed consent                            was obtained. Prior Anticoagulants: The patient has                            taken no previous anticoagulant or antiplatelet                            agents. ASA Grade Assessment: II - A patient with                            mild systemic disease. After reviewing the risks  and benefits, the patient was deemed in                            satisfactory condition to undergo the procedure.                           After obtaining informed consent, the colonoscope                            was passed under direct vision. Throughout the                            procedure, the patient's blood pressure, pulse, and                            oxygen saturations were monitored continuously. The                             Colonoscope was introduced through the anus and                            advanced to the the cecum, identified by                            appendiceal orifice and ileocecal valve. The                            colonoscopy was performed without difficulty. The                            patient tolerated the procedure. The quality of the                            bowel preparation was good. The ileocecal valve,                            appendiceal orifice, and rectum were photographed. Scope In: 1:36:43 PM Scope Out: 1:59:59 PM Scope Withdrawal Time: 0 hours 19 minutes 5 seconds  Total Procedure Duration: 0 hours 23 minutes 16 seconds  Findings:                 Skin tags were found on perianal exam.                           The digital rectal exam findings include                            hemorrhoids. Pertinent negatives include no                            palpable rectal lesions.                           Three sessile polyps were found in the  recto-sigmoid colon, transverse colon and cecum.                            The polyps were 3 to 10 mm in size (largest in the                            cecum). These polyps were removed with a cold                            snare. Resection and retrieval were complete.                           Many small-mouthed diverticula were found in the                            proximal rectum, recto-sigmoid colon, sigmoid colon                            and descending colon.                           A patchy area of mildly erythematous mucosa was                            found in the sigmoid colon, in the descending colon                            and in the transverse colon. This was biopsied with                            a cold forceps for histology to rule out chronic                            colitis though likely preparation artifact.                           Normal mucosa  was found in the entire colon                            otherwise.                           Non-bleeding non-thrombosed external and internal                            hemorrhoids were found during retroflexion, during                            perianal exam and during digital exam. The                            hemorrhoids were Grade II (internal hemorrhoids  that prolapse but reduce spontaneously). Complications:            No immediate complications. Estimated Blood Loss:     Estimated blood loss was minimal. Impression:               - Perianal skin tags found on perianal exam.                           - Hemorrhoids found on digital rectal exam.                           - Three 3 to 10 mm polyps at the recto-sigmoid                            colon, in the transverse colon and in the cecum,                            removed with a cold snare. Resected and retrieved.                           - Diverticulosis in the proximal rectum, in the                            recto-sigmoid colon, in the sigmoid colon and in                            the descending colon.                           - Patchy, erythematous mucosa in the sigmoid colon,                            in the descending colon and in the transverse                            colon. Biopsied to rule out chronic colitis vs                            preparation artifact.                           - Normal mucosa in the entire examined colon                            otherwise.                           - Non-bleeding non-thrombosed external and internal                            hemorrhoids. Recommendation:           - The patient will be observed post-procedure,                            until all discharge criteria are met.                           -  Discharge patient to home.                           - Patient has a contact number available for                            emergencies.  The signs and symptoms of potential                            delayed complications were discussed with the                            patient. Return to normal activities tomorrow.                            Written discharge instructions were provided to the                            patient.                           - High fiber diet.                           - Continue present medications.                           - Await pathology results.                           - Repeat colonoscopy in 3/5 years for surveillance                            based on pathology results.                           - Continue Fiber supplementation as you are doing.                           - Can consider Anusol suppositories in future.                           - If issues of recurrent rectal bleeding occur,                            then I recommend that a Colorectal Surgery                            evaluation be had due to the patient not only                            having internal hemorrhoids, but large external                            hemorrhoidal tags as well, that would not be  adequately treated with just typical hemorrhoidal                            banding.                           - The findings and recommendations were discussed                            with the patient. Justice Britain, MD 06/23/2019 2:10:15 PM

## 2019-06-25 ENCOUNTER — Telehealth: Payer: Self-pay | Admitting: *Deleted

## 2019-06-25 NOTE — Telephone Encounter (Signed)
  Follow up Call-  Call back number 06/23/2019  Post procedure Call Back phone  # [phone]  Permission to leave phone message Yes  Some recent data might be hidden     Patient questions:  Do you have a fever, pain , or abdominal swelling? No. Pain Score  0 *  Have you tolerated food without any problems? Yes.    Have you been able to return to your normal activities? Yes.    Do you have any questions about your discharge instructions: Diet   No. Medications  No. Follow up visit  No.  Do you have questions or concerns about your Care? No.  Actions: * If pain score is 4 or above: No action needed, pain <4. 1. Have you developed a fever since your procedure? no  2.   Have you had an respiratory symptoms (SOB or cough) since your procedure? no  3.   Have you tested positive for COVID 19 since your procedure no  4.   Have you had any family members/close contacts diagnosed with the COVID 19 since your procedure?  no   If yes to any of these questions please route to Joylene John, RN and Erenest Rasher, RN

## 2019-06-28 ENCOUNTER — Encounter: Payer: Self-pay | Admitting: Gastroenterology

## 2019-07-02 DIAGNOSIS — E038 Other specified hypothyroidism: Secondary | ICD-10-CM | POA: Diagnosis not present

## 2019-07-02 DIAGNOSIS — E039 Hypothyroidism, unspecified: Secondary | ICD-10-CM | POA: Diagnosis not present

## 2019-08-19 DIAGNOSIS — E039 Hypothyroidism, unspecified: Secondary | ICD-10-CM | POA: Diagnosis not present

## 2019-08-19 DIAGNOSIS — E038 Other specified hypothyroidism: Secondary | ICD-10-CM | POA: Diagnosis not present

## 2019-09-29 ENCOUNTER — Other Ambulatory Visit: Payer: Self-pay | Admitting: Internal Medicine

## 2019-09-29 DIAGNOSIS — R911 Solitary pulmonary nodule: Secondary | ICD-10-CM

## 2019-10-15 ENCOUNTER — Other Ambulatory Visit: Payer: Medicare Other

## 2019-10-19 ENCOUNTER — Other Ambulatory Visit: Payer: Self-pay | Admitting: Obstetrics & Gynecology

## 2019-10-19 DIAGNOSIS — Z1231 Encounter for screening mammogram for malignant neoplasm of breast: Secondary | ICD-10-CM

## 2019-10-26 ENCOUNTER — Ambulatory Visit
Admission: RE | Admit: 2019-10-26 | Discharge: 2019-10-26 | Disposition: A | Discharge status: Home / Self Care | Payer: Medicare Other | Source: Ambulatory Visit | Attending: Internal Medicine | Admitting: Internal Medicine

## 2019-10-26 DIAGNOSIS — R918 Other nonspecific abnormal finding of lung field: Secondary | ICD-10-CM | POA: Diagnosis not present

## 2019-10-26 DIAGNOSIS — R911 Solitary pulmonary nodule: Secondary | ICD-10-CM

## 2019-10-26 DIAGNOSIS — J8410 Pulmonary fibrosis, unspecified: Secondary | ICD-10-CM | POA: Diagnosis not present

## 2019-10-26 DIAGNOSIS — D71 Functional disorders of polymorphonuclear neutrophils: Secondary | ICD-10-CM | POA: Diagnosis not present

## 2019-10-29 ENCOUNTER — Other Ambulatory Visit: Payer: Self-pay

## 2019-10-29 ENCOUNTER — Ambulatory Visit
Admission: RE | Admit: 2019-10-29 | Discharge: 2019-10-29 | Disposition: A | Discharge status: Home / Self Care | Payer: Medicare Other | Source: Ambulatory Visit | Attending: Obstetrics & Gynecology | Admitting: Obstetrics & Gynecology

## 2019-10-29 DIAGNOSIS — Z1231 Encounter for screening mammogram for malignant neoplasm of breast: Secondary | ICD-10-CM | POA: Diagnosis not present

## 2019-11-02 DIAGNOSIS — Z6826 Body mass index (BMI) 26.0-26.9, adult: Secondary | ICD-10-CM | POA: Diagnosis not present

## 2019-11-02 DIAGNOSIS — Z01419 Encounter for gynecological examination (general) (routine) without abnormal findings: Secondary | ICD-10-CM | POA: Diagnosis not present

## 2020-01-14 DIAGNOSIS — Z23 Encounter for immunization: Secondary | ICD-10-CM | POA: Diagnosis not present

## 2020-05-18 DIAGNOSIS — E039 Hypothyroidism, unspecified: Secondary | ICD-10-CM | POA: Diagnosis not present

## 2020-05-18 DIAGNOSIS — E785 Hyperlipidemia, unspecified: Secondary | ICD-10-CM | POA: Diagnosis not present

## 2020-05-25 DIAGNOSIS — Z1339 Encounter for screening examination for other mental health and behavioral disorders: Secondary | ICD-10-CM | POA: Diagnosis not present

## 2020-05-25 DIAGNOSIS — E039 Hypothyroidism, unspecified: Secondary | ICD-10-CM | POA: Diagnosis not present

## 2020-05-25 DIAGNOSIS — M1990 Unspecified osteoarthritis, unspecified site: Secondary | ICD-10-CM | POA: Diagnosis not present

## 2020-05-25 DIAGNOSIS — M25519 Pain in unspecified shoulder: Secondary | ICD-10-CM | POA: Diagnosis not present

## 2020-05-25 DIAGNOSIS — M797 Fibromyalgia: Secondary | ICD-10-CM | POA: Diagnosis not present

## 2020-05-25 DIAGNOSIS — R82998 Other abnormal findings in urine: Secondary | ICD-10-CM | POA: Diagnosis not present

## 2020-05-25 DIAGNOSIS — Z0000 Encounter for general adult medical examination without abnormal findings: Secondary | ICD-10-CM | POA: Diagnosis not present

## 2020-05-25 DIAGNOSIS — Z1331 Encounter for screening for depression: Secondary | ICD-10-CM | POA: Diagnosis not present

## 2020-05-25 DIAGNOSIS — R059 Cough, unspecified: Secondary | ICD-10-CM | POA: Diagnosis not present

## 2020-05-25 DIAGNOSIS — E785 Hyperlipidemia, unspecified: Secondary | ICD-10-CM | POA: Diagnosis not present

## 2020-06-17 DIAGNOSIS — M67912 Unspecified disorder of synovium and tendon, left shoulder: Secondary | ICD-10-CM | POA: Diagnosis not present

## 2020-06-17 DIAGNOSIS — M19011 Primary osteoarthritis, right shoulder: Secondary | ICD-10-CM | POA: Diagnosis not present

## 2020-06-23 DIAGNOSIS — M19011 Primary osteoarthritis, right shoulder: Secondary | ICD-10-CM | POA: Diagnosis not present

## 2020-06-23 DIAGNOSIS — M25611 Stiffness of right shoulder, not elsewhere classified: Secondary | ICD-10-CM | POA: Diagnosis not present

## 2020-06-30 DIAGNOSIS — M25611 Stiffness of right shoulder, not elsewhere classified: Secondary | ICD-10-CM | POA: Diagnosis not present

## 2020-06-30 DIAGNOSIS — M19011 Primary osteoarthritis, right shoulder: Secondary | ICD-10-CM | POA: Diagnosis not present

## 2020-07-02 DIAGNOSIS — Z23 Encounter for immunization: Secondary | ICD-10-CM | POA: Diagnosis not present

## 2020-07-07 DIAGNOSIS — M19011 Primary osteoarthritis, right shoulder: Secondary | ICD-10-CM | POA: Diagnosis not present

## 2020-07-07 DIAGNOSIS — M25611 Stiffness of right shoulder, not elsewhere classified: Secondary | ICD-10-CM | POA: Diagnosis not present

## 2020-07-12 DIAGNOSIS — M19011 Primary osteoarthritis, right shoulder: Secondary | ICD-10-CM | POA: Diagnosis not present

## 2020-07-12 DIAGNOSIS — M25611 Stiffness of right shoulder, not elsewhere classified: Secondary | ICD-10-CM | POA: Diagnosis not present

## 2020-07-14 DIAGNOSIS — M25611 Stiffness of right shoulder, not elsewhere classified: Secondary | ICD-10-CM | POA: Diagnosis not present

## 2020-07-14 DIAGNOSIS — M19011 Primary osteoarthritis, right shoulder: Secondary | ICD-10-CM | POA: Diagnosis not present

## 2020-07-21 DIAGNOSIS — M19011 Primary osteoarthritis, right shoulder: Secondary | ICD-10-CM | POA: Diagnosis not present

## 2020-07-21 DIAGNOSIS — M25611 Stiffness of right shoulder, not elsewhere classified: Secondary | ICD-10-CM | POA: Diagnosis not present

## 2020-08-02 DIAGNOSIS — M19011 Primary osteoarthritis, right shoulder: Secondary | ICD-10-CM | POA: Diagnosis not present

## 2020-08-02 DIAGNOSIS — M25611 Stiffness of right shoulder, not elsewhere classified: Secondary | ICD-10-CM | POA: Diagnosis not present

## 2020-08-05 DIAGNOSIS — M25611 Stiffness of right shoulder, not elsewhere classified: Secondary | ICD-10-CM | POA: Diagnosis not present

## 2020-08-05 DIAGNOSIS — M19011 Primary osteoarthritis, right shoulder: Secondary | ICD-10-CM | POA: Diagnosis not present

## 2020-08-09 DIAGNOSIS — M25611 Stiffness of right shoulder, not elsewhere classified: Secondary | ICD-10-CM | POA: Diagnosis not present

## 2020-08-09 DIAGNOSIS — M19011 Primary osteoarthritis, right shoulder: Secondary | ICD-10-CM | POA: Diagnosis not present

## 2020-08-12 DIAGNOSIS — M19011 Primary osteoarthritis, right shoulder: Secondary | ICD-10-CM | POA: Diagnosis not present

## 2020-08-12 DIAGNOSIS — M25611 Stiffness of right shoulder, not elsewhere classified: Secondary | ICD-10-CM | POA: Diagnosis not present

## 2020-08-15 DIAGNOSIS — M19011 Primary osteoarthritis, right shoulder: Secondary | ICD-10-CM | POA: Diagnosis not present

## 2020-08-22 DIAGNOSIS — M19011 Primary osteoarthritis, right shoulder: Secondary | ICD-10-CM | POA: Diagnosis not present

## 2020-08-22 DIAGNOSIS — M25611 Stiffness of right shoulder, not elsewhere classified: Secondary | ICD-10-CM | POA: Diagnosis not present

## 2020-09-05 DIAGNOSIS — M25611 Stiffness of right shoulder, not elsewhere classified: Secondary | ICD-10-CM | POA: Diagnosis not present

## 2020-09-05 DIAGNOSIS — M19011 Primary osteoarthritis, right shoulder: Secondary | ICD-10-CM | POA: Diagnosis not present

## 2020-09-09 DIAGNOSIS — M25611 Stiffness of right shoulder, not elsewhere classified: Secondary | ICD-10-CM | POA: Diagnosis not present

## 2020-09-09 DIAGNOSIS — M19011 Primary osteoarthritis, right shoulder: Secondary | ICD-10-CM | POA: Diagnosis not present

## 2020-09-20 DIAGNOSIS — M25611 Stiffness of right shoulder, not elsewhere classified: Secondary | ICD-10-CM | POA: Diagnosis not present

## 2020-09-20 DIAGNOSIS — M19011 Primary osteoarthritis, right shoulder: Secondary | ICD-10-CM | POA: Diagnosis not present

## 2020-09-22 DIAGNOSIS — M25611 Stiffness of right shoulder, not elsewhere classified: Secondary | ICD-10-CM | POA: Diagnosis not present

## 2020-09-22 DIAGNOSIS — M19011 Primary osteoarthritis, right shoulder: Secondary | ICD-10-CM | POA: Diagnosis not present

## 2020-10-05 ENCOUNTER — Other Ambulatory Visit: Payer: Self-pay | Admitting: Obstetrics & Gynecology

## 2020-10-05 DIAGNOSIS — Z1231 Encounter for screening mammogram for malignant neoplasm of breast: Secondary | ICD-10-CM

## 2020-10-19 DIAGNOSIS — D2261 Melanocytic nevi of right upper limb, including shoulder: Secondary | ICD-10-CM | POA: Diagnosis not present

## 2020-10-19 DIAGNOSIS — L821 Other seborrheic keratosis: Secondary | ICD-10-CM | POA: Diagnosis not present

## 2020-10-19 DIAGNOSIS — L720 Epidermal cyst: Secondary | ICD-10-CM | POA: Diagnosis not present

## 2020-10-19 DIAGNOSIS — D2262 Melanocytic nevi of left upper limb, including shoulder: Secondary | ICD-10-CM | POA: Diagnosis not present

## 2020-10-19 DIAGNOSIS — D2271 Melanocytic nevi of right lower limb, including hip: Secondary | ICD-10-CM | POA: Diagnosis not present

## 2020-10-19 DIAGNOSIS — D2272 Melanocytic nevi of left lower limb, including hip: Secondary | ICD-10-CM | POA: Diagnosis not present

## 2020-10-19 DIAGNOSIS — L218 Other seborrheic dermatitis: Secondary | ICD-10-CM | POA: Diagnosis not present

## 2020-10-19 DIAGNOSIS — L570 Actinic keratosis: Secondary | ICD-10-CM | POA: Diagnosis not present

## 2020-10-19 DIAGNOSIS — L820 Inflamed seborrheic keratosis: Secondary | ICD-10-CM | POA: Diagnosis not present

## 2020-11-21 DIAGNOSIS — Z124 Encounter for screening for malignant neoplasm of cervix: Secondary | ICD-10-CM | POA: Diagnosis not present

## 2020-11-21 DIAGNOSIS — Z779 Other contact with and (suspected) exposures hazardous to health: Secondary | ICD-10-CM | POA: Diagnosis not present

## 2020-11-21 DIAGNOSIS — Z6825 Body mass index (BMI) 25.0-25.9, adult: Secondary | ICD-10-CM | POA: Diagnosis not present

## 2020-11-23 DIAGNOSIS — E039 Hypothyroidism, unspecified: Secondary | ICD-10-CM | POA: Diagnosis not present

## 2020-11-23 DIAGNOSIS — R002 Palpitations: Secondary | ICD-10-CM | POA: Diagnosis not present

## 2020-11-29 ENCOUNTER — Other Ambulatory Visit: Payer: Self-pay

## 2020-11-29 ENCOUNTER — Ambulatory Visit
Admission: RE | Admit: 2020-11-29 | Discharge: 2020-11-29 | Disposition: A | Discharge status: Home / Self Care | Payer: Medicare Other | Source: Ambulatory Visit | Attending: Obstetrics & Gynecology | Admitting: Obstetrics & Gynecology

## 2020-11-29 DIAGNOSIS — Z1231 Encounter for screening mammogram for malignant neoplasm of breast: Secondary | ICD-10-CM

## 2020-12-19 DIAGNOSIS — Z23 Encounter for immunization: Secondary | ICD-10-CM | POA: Diagnosis not present

## 2021-01-17 DIAGNOSIS — Z20822 Contact with and (suspected) exposure to covid-19: Secondary | ICD-10-CM | POA: Diagnosis not present

## 2021-01-23 DIAGNOSIS — E039 Hypothyroidism, unspecified: Secondary | ICD-10-CM | POA: Diagnosis not present

## 2021-01-25 DIAGNOSIS — Z23 Encounter for immunization: Secondary | ICD-10-CM | POA: Diagnosis not present

## 2021-02-27 DIAGNOSIS — R87610 Atypical squamous cells of undetermined significance on cytologic smear of cervix (ASC-US): Secondary | ICD-10-CM | POA: Diagnosis not present

## 2021-02-27 DIAGNOSIS — N950 Postmenopausal bleeding: Secondary | ICD-10-CM | POA: Diagnosis not present

## 2021-02-27 DIAGNOSIS — Z124 Encounter for screening for malignant neoplasm of cervix: Secondary | ICD-10-CM | POA: Diagnosis not present

## 2021-03-02 HISTORY — PX: DILATION AND CURETTAGE OF UTERUS: SHX78

## 2021-03-06 DIAGNOSIS — N950 Postmenopausal bleeding: Secondary | ICD-10-CM | POA: Diagnosis not present

## 2021-03-07 DIAGNOSIS — N8502 Endometrial intraepithelial neoplasia [EIN]: Secondary | ICD-10-CM | POA: Diagnosis not present

## 2021-03-14 DIAGNOSIS — N950 Postmenopausal bleeding: Secondary | ICD-10-CM | POA: Diagnosis not present

## 2021-03-16 DIAGNOSIS — N8502 Endometrial intraepithelial neoplasia [EIN]: Secondary | ICD-10-CM | POA: Diagnosis not present

## 2021-03-16 DIAGNOSIS — N8500 Endometrial hyperplasia, unspecified: Secondary | ICD-10-CM | POA: Diagnosis not present

## 2021-03-31 DIAGNOSIS — Z20822 Contact with and (suspected) exposure to covid-19: Secondary | ICD-10-CM | POA: Diagnosis not present

## 2021-04-24 DIAGNOSIS — N8500 Endometrial hyperplasia, unspecified: Secondary | ICD-10-CM | POA: Diagnosis not present

## 2021-04-26 IMAGING — MG DIGITAL SCREENING BILAT W/ TOMO W/ CAD
6 of 10 series · 6 of 30 positions shown · non-contrast
Comparison: Previous exam(s).

CLINICAL DATA: Screening.

EXAM:
DIGITAL SCREENING BILATERAL MAMMOGRAM WITH TOMO AND CAD

[L CC synth-2D]
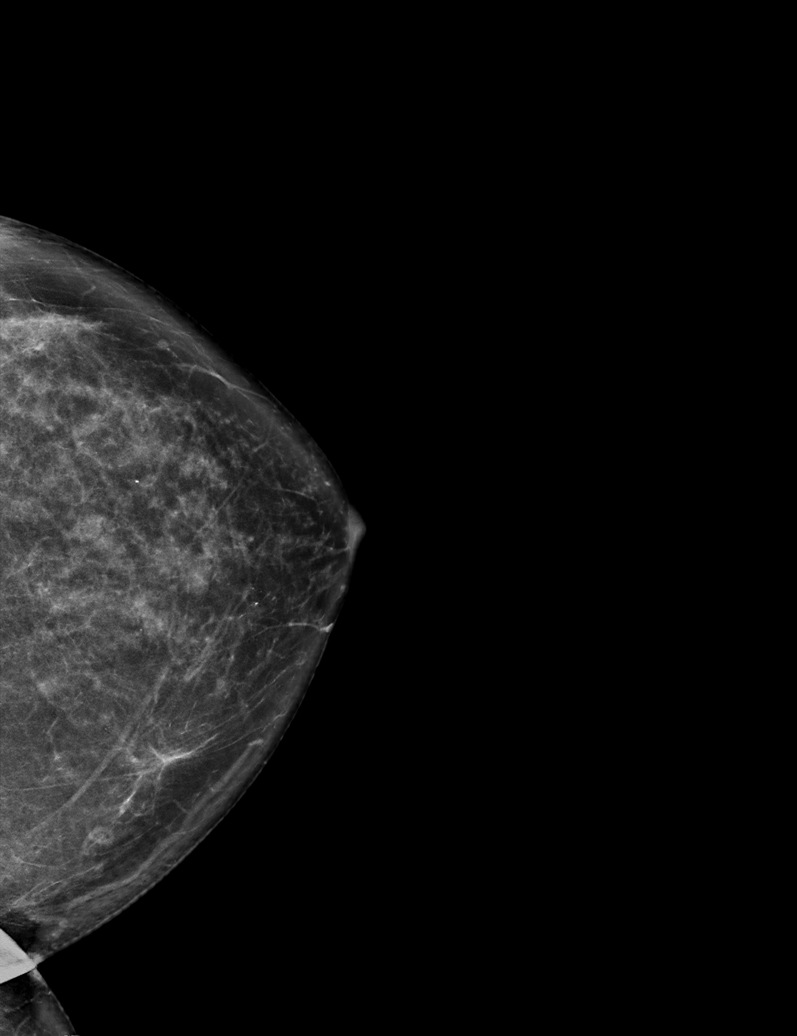

[L MLO synth-2D]
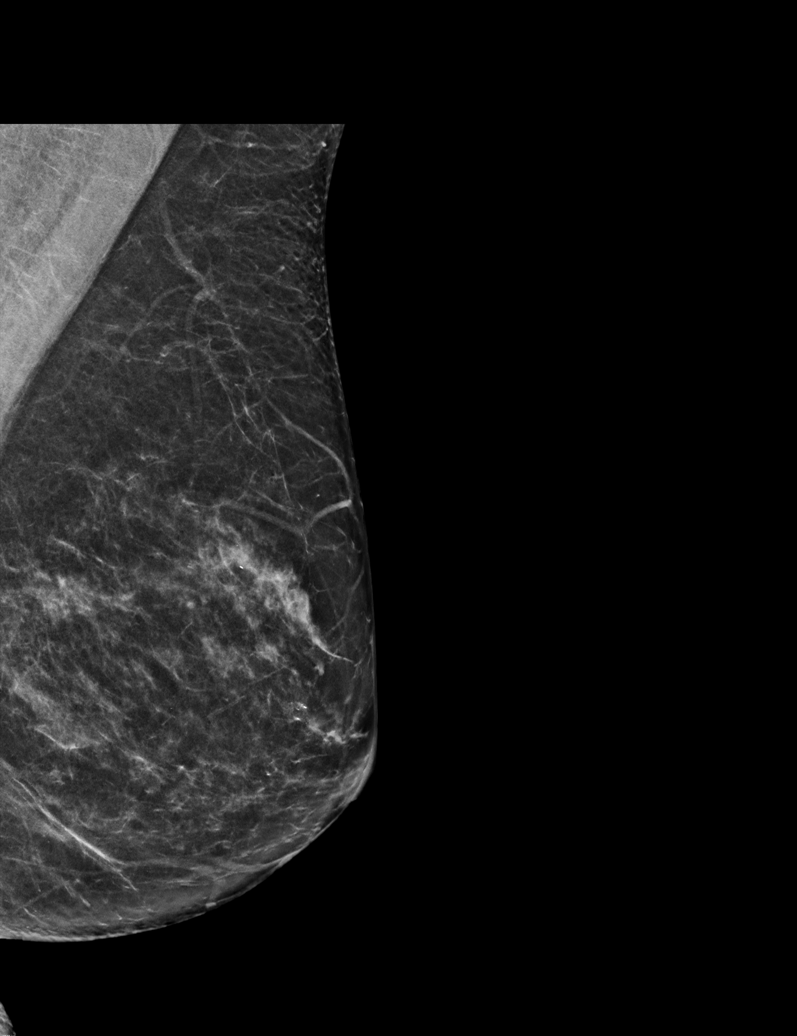

[L CV synth-2D]
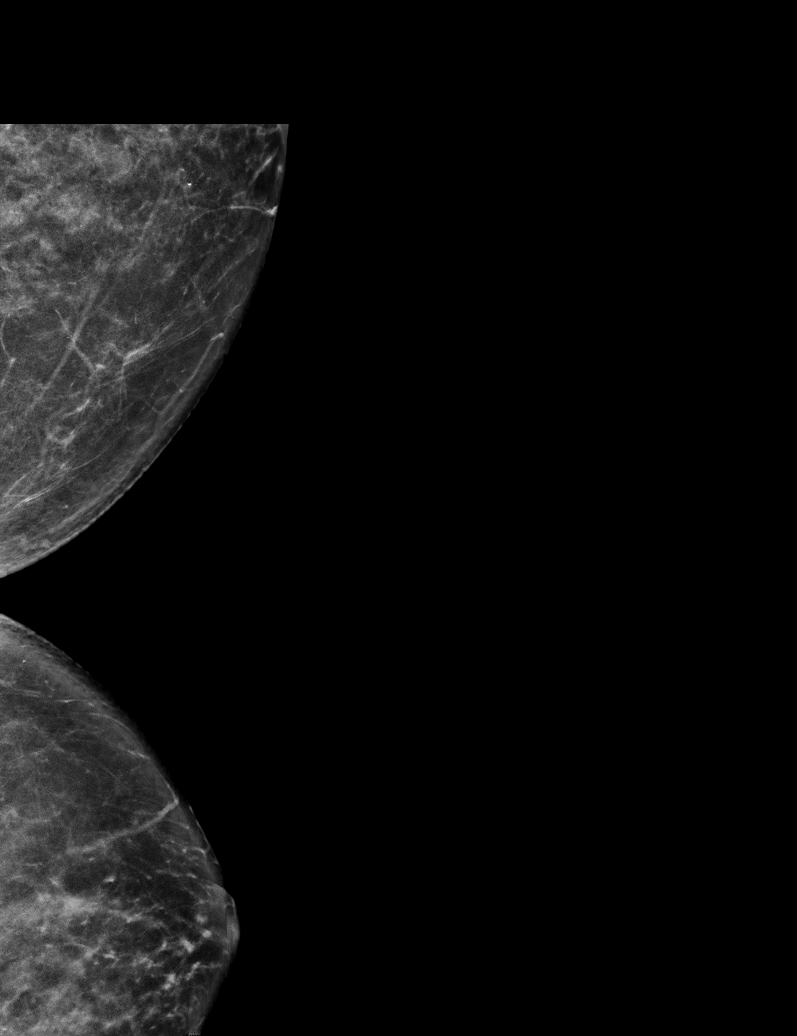

[R CC synth-2D]
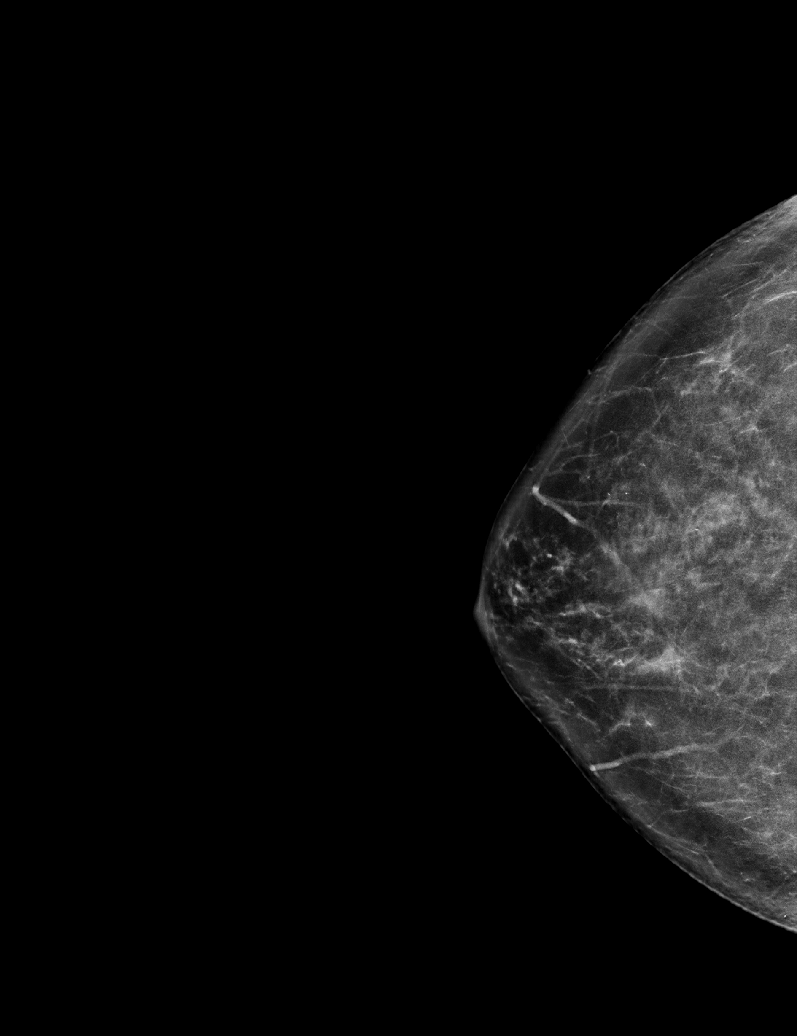

[R MLO synth-2D]
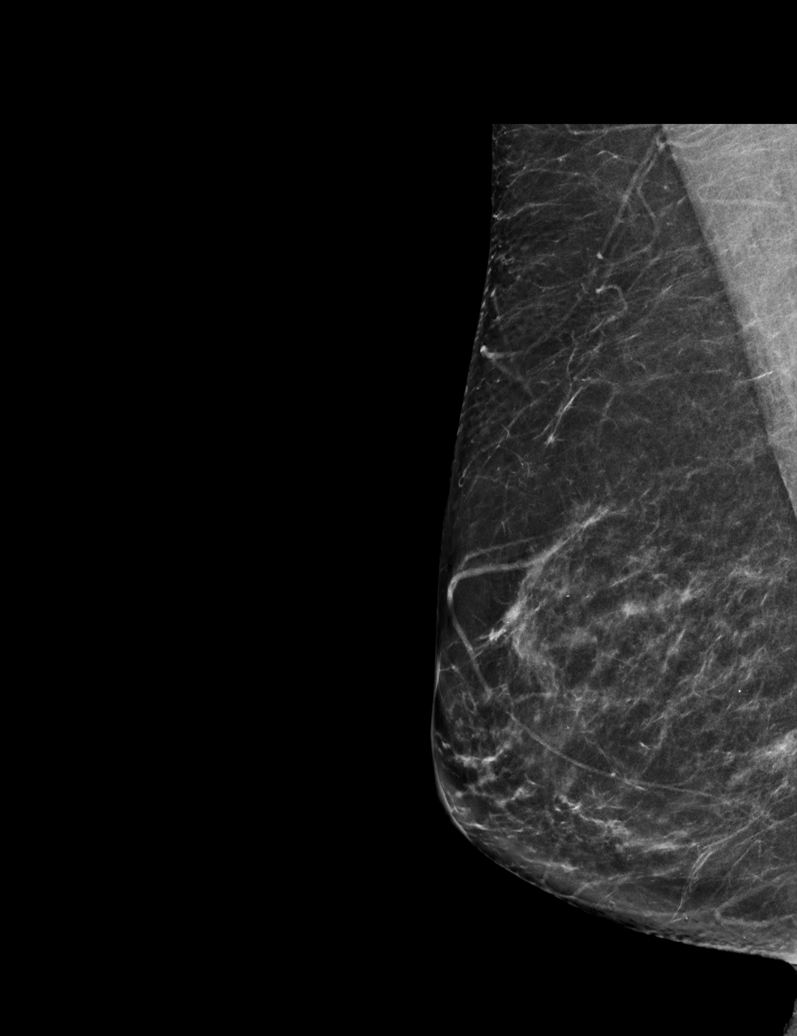

[L CV tomo · tomo slice 23/45.0]
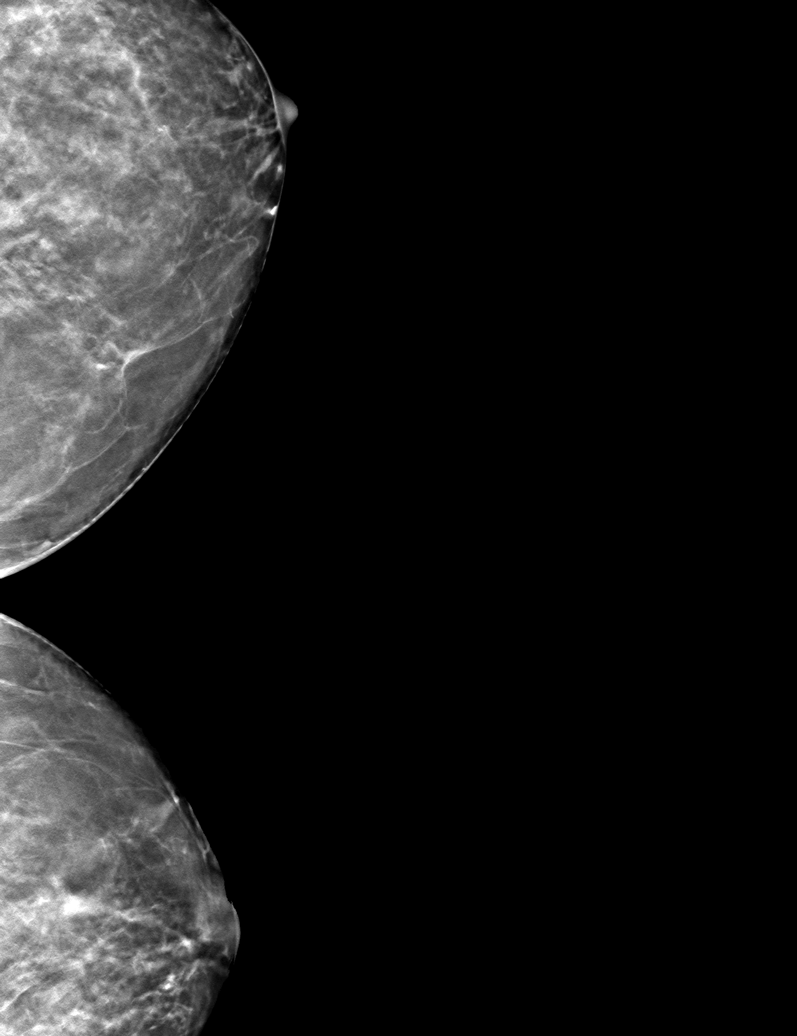

[6 of 30 positions shown; findings below may reference images not displayed]

ACR Breast Density Category b: There are scattered areas of
fibroglandular density.
FINDINGS: There are no findings suspicious for malignancy. Images were
processed with CAD.
IMPRESSION: No mammographic evidence of malignancy. A result letter of this
screening mammogram will be mailed directly to the patient.

RECOMMENDATION:
Screening mammogram in one year. (Code:[id number])

BI-RADS CATEGORY  1: Negative.

## 2021-05-18 DIAGNOSIS — E785 Hyperlipidemia, unspecified: Secondary | ICD-10-CM | POA: Diagnosis not present

## 2021-05-18 DIAGNOSIS — Z780 Asymptomatic menopausal state: Secondary | ICD-10-CM | POA: Diagnosis not present

## 2021-05-18 DIAGNOSIS — E039 Hypothyroidism, unspecified: Secondary | ICD-10-CM | POA: Diagnosis not present

## 2021-06-01 DIAGNOSIS — R911 Solitary pulmonary nodule: Secondary | ICD-10-CM | POA: Diagnosis not present

## 2021-06-01 DIAGNOSIS — N8500 Endometrial hyperplasia, unspecified: Secondary | ICD-10-CM | POA: Diagnosis not present

## 2021-06-01 DIAGNOSIS — M1990 Unspecified osteoarthritis, unspecified site: Secondary | ICD-10-CM | POA: Diagnosis not present

## 2021-06-01 DIAGNOSIS — E785 Hyperlipidemia, unspecified: Secondary | ICD-10-CM | POA: Diagnosis not present

## 2021-06-01 DIAGNOSIS — E039 Hypothyroidism, unspecified: Secondary | ICD-10-CM | POA: Diagnosis not present

## 2021-06-01 DIAGNOSIS — M797 Fibromyalgia: Secondary | ICD-10-CM | POA: Diagnosis not present

## 2021-06-01 DIAGNOSIS — Z0000 Encounter for general adult medical examination without abnormal findings: Secondary | ICD-10-CM | POA: Diagnosis not present

## 2021-06-02 ENCOUNTER — Telehealth: Payer: Self-pay | Admitting: *Deleted

## 2021-06-02 NOTE — Telephone Encounter (Signed)
Spoke with the patient and scheduled a new patient appt with Dr Berline Lopes on 3/17 at 9:45 am; patient given the arrival time of 9:15 am. Patient given the address and phone number for the clinic; along with the policy for mask and visitors. Explained that the office would move her appt up to an earlier date is one comes available  ?

## 2021-06-08 ENCOUNTER — Encounter: Payer: Self-pay | Admitting: Gynecologic Oncology

## 2021-06-08 ENCOUNTER — Telehealth: Payer: Self-pay | Admitting: *Deleted

## 2021-06-08 NOTE — Telephone Encounter (Signed)
Spoke with the patient and moved her appt from 3/17 to 3/10 ?

## 2021-06-08 NOTE — H&P (View-Only) (Signed)
GYNECOLOGIC ONCOLOGY NEW PATIENT CONSULTATION  ? ?Patient Name: Helen Gardner  ?Patient Age: 76 y.o. ?Date of Service: 06/09/2021 ?Referring Provider: Dr. Everlene Farrier ? ?Primary Care Provider: Velna Hatchet, MD ?Consulting Provider: Jeral Pinch, MD  ? ?Assessment/Plan:  ?Postmenopausal patient with complex atypical hyperplasia. ? ?We reviewed the diagnosis of complex atypical hyperplasia (CAH) and the treatment options, including medical management (Mirena IUD or progesterone PO) or hysterectomy.  Given her age and general good health, I recommend that we proceed with definitive surgical management.  The patient is amenable to proceeding with surgery.The patient is a suitable candidate for surgery via a minimally invasive approach.  We reviewed that robotic assistance would be used to complete the surgery.  ? ?The patient is a suitable candidate for hysterectomy via a minimally invasive approach to surgery.  Given that she is postmenopausal, a bilateral salpingo-oophorectomy is also recommended.  We reviewed that robotic assistance would be used to complete the surgery.  We discussed that endometrial cancer is detected in about 40% of final uterine pathology specimens from patients with CAH.   ? ?We discussed 2 options for surgery.  The first, which would be what is considered standard of care for complex atypical hyperplasia, would be to perform a total hysterectomy and plan to send the uterus for intraoperative frozen pathology.  If cancer is identified at the time of surgery, additional procedures including lymph node evaluation, is recommended.  The other option would be to proceed with sentinel lymph node biopsy.  This would be done before the hysterectomy and, if successful, then no tissue would be sent for frozen section on the day of surgery.  I reviewed that if no malignancy was identified, then sentinel lymph node biopsy would be overtreatment.  If malignancy was identified, sentinel lymph  node biopsy would eliminate the need for full lymphadenectomy.  After discussing the risks and benefits of the each as well as associated morbidity, the patient wishes to proceed with sentinel lymph node biopsy. ? ?We reviewed the sentinel lymph node technique. Risks and benefits of sentinel lymph node biopsy was reviewed. We reviewed the technique and ICG dye. The patient DOES NOT have an iodine allergy or known liver dysfunction. We reviewed the false negative rate (0.4%), and that 3% of patients with metastatic disease will not have it detected by SLN biopsy in endometrial cancer. A low risk of allergic reaction to the dye, <0.2% for ICG, has been reported. We also discussed that in the case of failed mapping, which occurs 40% of the time, a bilateral or unilateral lymphadenectomy will be performed at the surgeon?s discretion.  ? ?Potential benefits of sentinel nodes including a higher detection rate for metastasis due to ultrastaging and potential reduction in operative morbidity. However, there remains uncertainty as to the role for treatment of micrometastatic disease. Further, the benefit of operative morbidity associated with the SLN technique in endometrial cancer is not yet completely known. In other patient populations (e.g. the cervical cancer population) there has been observed reductions in morbidity with SLN biopsy compared to pelvic lymphadenectomy. Lymphedema, nerve dysfunction and lymphocysts are all potential risks with the SLN technique as with complete lymphadenectomy. Additional risks to the patient include the risk of damage to an internal organ while operating in an altered view (e.g. the black and white image of the robotic fluorescence imaging mode).  ? ?We discussed the plan for a robotic assisted hysterectomy, bilateral salpingo-oophorectomy, sentinel lymph node evaluation, possible lymph node dissection, possible laparotomy. The risks  of surgery were discussed in detail and she  understands these to include infection; wound separation; hernia; vaginal cuff separation, injury to adjacent organs such as bowel, bladder, blood vessels, ureters and nerves; bleeding which may require blood transfusion; anesthesia risk; thromboembolic events; possible death; unforeseen complications; possible need for re-exploration; medical complications such as heart attack, stroke, pleural effusion and pneumonia; and, if full lymphadenectomy is performed the risk of lymphedema and lymphocyst. The patient will receive DVT and antibiotic prophylaxis as indicated. She voiced a clear understanding. She had the opportunity to ask questions. Perioperative instructions were reviewed with her. Prescriptions for post-op medications were sent to her pharmacy of choice. ? ?The patient has already stopped her daily oral progesterone.  She has not changed her estrogen patch recently and I recommended that she come off of it.  If she develops symptomatic hot flashes again postop, we can put her back on the lowest dose estrogen replacement to control her symptoms. ? ?Given strong family history of colon and pancreatic cancer, I discussed referral to genetics today regardless of final pathology at the time of her upcoming hysterectomy.  We will plan to have this scheduled for after her recovery from surgery. ? ?A copy of this note was sent to the patient's referring provider.  ? ?65 minutes of total time was spent for this patient encounter, including preparation, face-to-face counseling with the patient and coordination of care, and documentation of the encounter. ? ? ?Jeral Pinch, MD  ?Division of Gynecologic Oncology  ?Department of Obstetrics and Gynecology  ?[facility]  ?___________________________________________  ?Chief Complaint: ?Chief Complaint  ?Patient presents with  ? Complex atypical endometrial hyperplasia  ? ? ?History of Present Illness:  ?Helen Gardner is a 76 y.o. y.o.  female who is seen in consultation at the request of Dr. Gaetano Net for an evaluation of complex atypical hyperplasia. ? ?The patient was seen in August for well woman's exam.  She had a Pap smear at that time that was mildly abnormal.  Recommendation was for follow-up in November with repeat Pap testing.  When she saw her OB/GYN for that visit, she was having very scant vaginal spotting, that she describes as infrequently.  She is unsure when this started.  She mentioned it at that visit and subsequently underwent pelvic ultrasound in November, endometrial biopsy in December, and [facility] in mid December.  She had some heavier vaginal bleeding after her D&C and then her bleeding stopped completely.  This past Wednesday, she began having some light vaginal spotting again, somewhat more than what her bleeding has been back in November. ? ?The following is a summary of her recent pathology and imaging: ?11/21/2020: Pap test-ASCUS. ?02/27/2021: Pap test negative for intraepithelial lesion or malignancy. ?02/27/2021: [facility] measures 7.9 x 5.7 x 5.9 cm.  Endometrium slightly thickened measuring 6.6 mm.  Neither ovary seen. ?03/07/2021: Endometrial biopsy shows complex atypical hyperplasia.  Crowded irregular glands with cytologic atypia consistent with CAH noted.  Many glands are fragmented with associated stroma. ?03/16/2021: Endometrial curettings show fragment of endometrium with complex atypical hyperplasia.  Multiple fragments of glandular mucosa with variable nuclear atypia noted. ?04/24/2021: [facility] uterus measuring 9 x 5.2 x 5.7 cm with an endometrial lining measuring 6.3 mm.  Neither ovary visualized. ? ?Patient reports overall doing well.  She denies any abdominal pain or pelvic pain.  She endorses occasional nonbothersome cramping.  She endorses  a good appetite without nausea or emesis.  She has some urinary frequency, at  baseline, and has some incontinence if she coughs when her bladder is very full.  She has a history of IBS which is currently well controlled on Metamucil. ? ?She has a chronic cough secondary to postnasal drainage

## 2021-06-08 NOTE — Progress Notes (Signed)
GYNECOLOGIC ONCOLOGY NEW PATIENT CONSULTATION   Patient Name: Helen Gardner  Patient Age: 76 y.o. Date of Service: 06/09/2021 Referring Provider: Dr. Everlene Farrier  Primary Care Provider: Velna Hatchet, MD Consulting Provider: Jeral Pinch, MD   Assessment/Plan:  Postmenopausal patient with complex atypical hyperplasia.  We reviewed the diagnosis of complex atypical hyperplasia (CAH) and the treatment options, including medical management (Mirena IUD or progesterone PO) or hysterectomy.  Given her age and general good health, I recommend that we proceed with definitive surgical management.  The patient is amenable to proceeding with surgery.The patient is a suitable candidate for surgery via a minimally invasive approach.  We reviewed that robotic assistance would be used to complete the surgery.   The patient is a suitable candidate for hysterectomy via a minimally invasive approach to surgery.  Given that she is postmenopausal, a bilateral salpingo-oophorectomy is also recommended.  We reviewed that robotic assistance would be used to complete the surgery.  We discussed that endometrial cancer is detected in about 40% of final uterine pathology specimens from patients with CAH.    We discussed 2 options for surgery.  The first, which would be what is considered standard of care for complex atypical hyperplasia, would be to perform a total hysterectomy and plan to send the uterus for intraoperative frozen pathology.  If cancer is identified at the time of surgery, additional procedures including lymph node evaluation, is recommended.  The other option would be to proceed with sentinel lymph node biopsy.  This would be done before the hysterectomy and, if successful, then no tissue would be sent for frozen section on the day of surgery.  I reviewed that if no malignancy was identified, then sentinel lymph node biopsy would be overtreatment.  If malignancy was identified, sentinel lymph  node biopsy would eliminate the need for full lymphadenectomy.  After discussing the risks and benefits of the each as well as associated morbidity, the patient wishes to proceed with sentinel lymph node biopsy.  We reviewed the sentinel lymph node technique. Risks and benefits of sentinel lymph node biopsy was reviewed. We reviewed the technique and ICG dye. The patient DOES NOT have an iodine allergy or known liver dysfunction. We reviewed the false negative rate (0.4%), and that 3% of patients with metastatic disease will not have it detected by SLN biopsy in endometrial cancer. A low risk of allergic reaction to the dye, <0.2% for ICG, has been reported. We also discussed that in the case of failed mapping, which occurs 40% of the time, a bilateral or unilateral lymphadenectomy will be performed at the surgeons discretion.   Potential benefits of sentinel nodes including a higher detection rate for metastasis due to ultrastaging and potential reduction in operative morbidity. However, there remains uncertainty as to the role for treatment of micrometastatic disease. Further, the benefit of operative morbidity associated with the SLN technique in endometrial cancer is not yet completely known. In other patient populations (e.g. the cervical cancer population) there has been observed reductions in morbidity with SLN biopsy compared to pelvic lymphadenectomy. Lymphedema, nerve dysfunction and lymphocysts are all potential risks with the SLN technique as with complete lymphadenectomy. Additional risks to the patient include the risk of damage to an internal organ while operating in an altered view (e.g. the black and white image of the robotic fluorescence imaging mode).   We discussed the plan for a robotic assisted hysterectomy, bilateral salpingo-oophorectomy, sentinel lymph node evaluation, possible lymph node dissection, possible laparotomy. The risks  of surgery were discussed in detail and she  understands these to include infection; wound separation; hernia; vaginal cuff separation, injury to adjacent organs such as bowel, bladder, blood vessels, ureters and nerves; bleeding which may require blood transfusion; anesthesia risk; thromboembolic events; possible death; unforeseen complications; possible need for re-exploration; medical complications such as heart attack, stroke, pleural effusion and pneumonia; and, if full lymphadenectomy is performed the risk of lymphedema and lymphocyst. The patient will receive DVT and antibiotic prophylaxis as indicated. She voiced a clear understanding. She had the opportunity to ask questions. Perioperative instructions were reviewed with her. Prescriptions for post-op medications were sent to her pharmacy of choice.  The patient has already stopped her daily oral progesterone.  She has not changed her estrogen patch recently and I recommended that she come off of it.  If she develops symptomatic hot flashes again postop, we can put her back on the lowest dose estrogen replacement to control her symptoms.  Given strong family history of colon and pancreatic cancer, I discussed referral to genetics today regardless of final pathology at the time of her upcoming hysterectomy.  We will plan to have this scheduled for after her recovery from surgery.  A copy of this note was sent to the patient's referring provider.   65 minutes of total time was spent for this patient encounter, including preparation, face-to-face counseling with the patient and coordination of care, and documentation of the encounter.   Jeral Pinch, MD  Division of Gynecologic Oncology  Department of Obstetrics and Gynecology  [facility]  ___________________________________________  Chief Complaint: Chief Complaint  Patient presents with   Complex atypical endometrial hyperplasia    History of Present Illness:  Helen Gardner is a 76 y.o. y.o.  female who is seen in consultation at the request of Dr. Gaetano Net for an evaluation of complex atypical hyperplasia.  The patient was seen in August for well woman's exam.  She had a Pap smear at that time that was mildly abnormal.  Recommendation was for follow-up in November with repeat Pap testing.  When she saw her OB/GYN for that visit, she was having very scant vaginal spotting, that she describes as infrequently.  She is unsure when this started.  She mentioned it at that visit and subsequently underwent pelvic ultrasound in November, endometrial biopsy in December, and [facility] in mid December.  She had some heavier vaginal bleeding after her D&C and then her bleeding stopped completely.  This past Wednesday, she began having some light vaginal spotting again, somewhat more than what her bleeding has been back in November.  The following is a summary of her recent pathology and imaging: 11/21/2020: Pap test-ASCUS. 02/27/2021: Pap test negative for intraepithelial lesion or malignancy. 02/27/2021: [facility] uterus measures 7.9 x 5.7 x 5.9 cm.  Endometrium slightly thickened measuring 6.6 mm.  Neither ovary seen. 03/07/2021: Endometrial biopsy shows complex atypical hyperplasia.  Crowded irregular glands with cytologic atypia consistent with CAH noted.  Many glands are fragmented with associated stroma. 03/16/2021: Endometrial curettings show fragment of endometrium with complex atypical hyperplasia.  Multiple fragments of glandular mucosa with variable nuclear atypia noted. 04/24/2021: [facility] uterus measuring 9 x 5.2 x 5.7 cm with an endometrial lining measuring 6.3 mm.  Neither ovary visualized.  Patient reports overall doing well.  She denies any abdominal pain or pelvic pain.  She endorses occasional nonbothersome cramping.  She endorses  a good appetite without nausea or emesis.  She has some urinary frequency, at  baseline, and has some incontinence if she coughs when her bladder is very full.  She has a history of IBS which is currently well controlled on Metamucil.  She has a chronic cough secondary to postnasal drainage.  She has a family history of colon and pancreatic cancer as outlined in her family history.  She also thinks that one of her paternal aunts may have had uterine cancer.  PAST MEDICAL HISTORY:  Past Medical History:  Diagnosis Date   Chronic cough    due to postnasal drainage   Chronic fatigue    Complex atypical endometrial hyperplasia    Fibromyalgia    Hyperlipidemia    LDL goal = < 120, ideally < 90   hypothyroidism    IBS (irritable bowel syndrome)    intermittent symptoms     PAST SURGICAL HISTORY:  Past Surgical History:  Procedure Laterality Date   APPENDECTOMY     infected, age 65   COLONOSCOPY  08/30/2011   Dr Sammuel Cooper   DILATION AND CURETTAGE OF UTERUS N/A 03/2021   TEMPOROMANDIBULAR JOINT SURGERY N/A    thyroglossal duct cystectomy     TONSILLECTOMY     WISDOM TOOTH EXTRACTION      OB/GYN HISTORY:  OB History  Gravida Para Term Preterm AB Living  2 2          SAB IAB Ectopic Multiple Live Births               # Outcome Date GA Lbr Len/2nd Weight Sex Delivery Anes PTL Lv  2 Para           1 Para             No LMP recorded. Patient is postmenopausal.  Age at menarche: [age] Age at menopause: Years ago Hx of HRT: Yes, has been on estrogen and progesterone replacement for many years secondary to symptomatic hot flashes.  She has periodically stopped her HRT for a month or 2 with recurrence of hot flashes. Hx of STDs: Denies Last pap: See HPI History of abnormal pap smears: Yes  SCREENING STUDIES:  Last mammogram: 10/2020  Last colonoscopy: 06/2019  MEDICATIONS: Outpatient Encounter Medications as of 06/09/2021  Medication Sig   ARMOUR THYROID 90 MG tablet Take 90 mg by mouth daily.   estradiol (VIVELLE-DOT) 0.05 MG/24HR patch     fluocinonide cream (LIDEX) 0.08 % Apply 1 application topically 2 (two) times daily.   Psyllium (METAMUCIL PO) Take by mouth.   senna-docusate (SENOKOT-S) 8.6-50 MG tablet Take 2 tablets by mouth at bedtime. For AFTER surgery, do not take if having diarrhea   traMADol (ULTRAM) 50 MG tablet Take 1 tablet (50 mg total) by mouth every 6 (six) hours as needed for severe pain. For AFTER surgery only, do not take and drive   traZODone (DESYREL) 100 MG tablet Take 100 mg by mouth at bedtime.     VITAMIN D PO Take by mouth.   zolpidem (AMBIEN) 10 MG tablet Take 10 mg by mouth at bedtime as needed.     [DISCONTINUED] hydrocortisone (ANUSOL-HC) 25 MG suppository Place 1 suppository (25 mg total) rectally every 12 (twelve) hours. Nightly for one week, then every other night until completion of prescription. (Patient not taking: Reported on 06/08/2021)   [DISCONTINUED] progesterone (PROMETRIUM) 100 MG capsule Take 100 mg by mouth at bedtime. (Patient not taking: Reported on 06/08/2021)  No facility-administered encounter medications on file as of 06/09/2021.    ALLERGIES:  Allergies  Allergen Reactions   Sulfonamide Derivatives     Rash, urticarial    Sulfamethoxazole Hives   Hydrocodone     Hyperactivity; heart racing!   Hydrocodone-Acetaminophen Palpitations     FAMILY HISTORY:  Family History  Problem Relation Age of Onset   Cancer Mother        colon, pancreatic,   Colon cancer Mother 15   Pancreatic cancer Mother    Cancer Father        lung, smoker   Hypertension Brother    Hyperlipidemia Brother    Colon polyps Brother    Lupus Paternal Aunt    Arthritis Paternal Aunt         RA in 2; 4 aunts with arthritis   Cancer Paternal Aunt        uterine   Heart disease Maternal Grandfather        MI @ 69   Cancer Maternal Grandfather        pancreatic   Pancreatic cancer Maternal Grandfather    Hyperlipidemia Paternal Grandmother    Stroke Paternal Grandmother    Tourette syndrome Son     Colon cancer Maternal Great-grandfather    Esophageal cancer Neg Hx    Rectal cancer Neg Hx    Stomach cancer Neg Hx    Breast cancer Neg Hx    Ovarian cancer Neg Hx    Endometrial cancer Neg Hx    Prostate cancer Neg Hx      SOCIAL HISTORY:  Social Connections: Not on file    REVIEW OF SYSTEMS:  + Ringing in ears, chronic cough, vaginal bleeding. Denies appetite changes, fevers, chills, fatigue, unexplained weight changes. Denies hearing loss, neck lumps or masses, mouth sores, or voice changes. Denies wheezing.  Denies shortness of breath. Denies chest pain or palpitations. Denies leg swelling. Denies abdominal distention, pain, blood in stools, constipation, diarrhea, nausea, vomiting, or early satiety. Denies pain with intercourse, dysuria, frequency, hematuria or incontinence. Denies hot flashes, pelvic pain, or vaginal discharge.   Denies joint pain, back pain or muscle pain/cramps. Denies itching, rash, or wounds. Denies dizziness, headaches, numbness or seizures. Denies swollen lymph nodes or glands, denies easy bruising or bleeding. Denies anxiety, depression, confusion, or decreased concentration.  Physical Exam:  Vital Signs for this encounter:  Blood pressure (!) 163/69, pulse 81, temperature 98.4 F (36.9 C), temperature source Oral, resp. rate 18, height 5' 7.13" (1.705 m), weight 156 lb 12.8 oz (71.1 kg), SpO2 100 %. Body mass index is 24.47 kg/m. General: Alert, oriented, no acute distress.  HEENT: Normocephalic, atraumatic. Sclera anicteric.  Chest: Clear to auscultation bilaterally. No wheezes, rhonchi, or rales. Cardiovascular: Regular rate and rhythm, no murmurs, rubs, or gallops.  Abdomen: Normoactive bowel sounds. Soft, nondistended, nontender to palpation. No masses or hepatosplenomegaly appreciated. No palpable fluid wave.  Extremities: Grossly normal range of motion. Warm, well perfused. No edema bilaterally.  Skin: No rashes or lesions.   Lymphatics: No cervical, supraclavicular, or inguinal adenopathy.  GU:  Normal external female genitalia. No lesions. No discharge or bleeding.             Bladder/urethra:  No lesions or masses, well supported bladder             Vagina: No lesions or masses.  Minimal blood within the vault.             Cervix: Normal appearing, no  lesions.             Uterus: Small, mobile, no parametrial involvement or nodularity.             Adnexa: No masses appreciated.  Rectal: Deferred.  LABORATORY AND RADIOLOGIC DATA:  Outside medical records were reviewed to synthesize the above history, along with the history and physical obtained during the visit.   Lab Results  Component Value Date   WBC 6.6 01/10/2011   HGB 12.8 01/10/2011   HCT 38.6 01/10/2011   PLT 232.0 01/10/2011   GLUCOSE 101 (H) 01/10/2011   CHOL 247 (H) 01/10/2011   TRIG 89.0 01/10/2011   HDL 78.30 01/10/2011   LDLDIRECT 154.3 01/10/2011   ALT 15 01/10/2011   AST 23 01/10/2011   NA 144 01/10/2011   K 5.8 (H) 01/10/2011   CL 108 01/10/2011   CREATININE 0.8 01/10/2011   BUN 19 01/10/2011   CO2 28 01/10/2011   TSH 0.19 (L) 01/10/2011   HGBA1C 5.3 01/10/2011

## 2021-06-09 ENCOUNTER — Other Ambulatory Visit: Payer: Self-pay

## 2021-06-09 ENCOUNTER — Inpatient Hospital Stay (HOSPITAL_BASED_OUTPATIENT_CLINIC_OR_DEPARTMENT_OTHER): Payer: Medicare HMO | Admitting: Gynecologic Oncology

## 2021-06-09 ENCOUNTER — Inpatient Hospital Stay: Payer: Medicare HMO | Attending: Gynecologic Oncology | Admitting: Gynecologic Oncology

## 2021-06-09 ENCOUNTER — Encounter: Payer: Self-pay | Admitting: Gynecologic Oncology

## 2021-06-09 VITALS — BP 163/69 | HR 81 | Temp 98.4°F | Resp 18 | Ht 67.13 in | Wt 156.8 lb

## 2021-06-09 DIAGNOSIS — Z800 Family history of malignant neoplasm of digestive organs: Secondary | ICD-10-CM | POA: Diagnosis not present

## 2021-06-09 DIAGNOSIS — K589 Irritable bowel syndrome without diarrhea: Secondary | ICD-10-CM | POA: Diagnosis not present

## 2021-06-09 DIAGNOSIS — R350 Frequency of micturition: Secondary | ICD-10-CM | POA: Insufficient documentation

## 2021-06-09 DIAGNOSIS — N8502 Endometrial intraepithelial neoplasia [EIN]: Secondary | ICD-10-CM | POA: Diagnosis not present

## 2021-06-09 DIAGNOSIS — N393 Stress incontinence (female) (male): Secondary | ICD-10-CM | POA: Diagnosis not present

## 2021-06-09 DIAGNOSIS — E785 Hyperlipidemia, unspecified: Secondary | ICD-10-CM | POA: Diagnosis not present

## 2021-06-09 DIAGNOSIS — R053 Chronic cough: Secondary | ICD-10-CM | POA: Insufficient documentation

## 2021-06-09 DIAGNOSIS — Z79899 Other long term (current) drug therapy: Secondary | ICD-10-CM | POA: Insufficient documentation

## 2021-06-09 DIAGNOSIS — M797 Fibromyalgia: Secondary | ICD-10-CM | POA: Insufficient documentation

## 2021-06-09 DIAGNOSIS — R0982 Postnasal drip: Secondary | ICD-10-CM | POA: Insufficient documentation

## 2021-06-09 DIAGNOSIS — E039 Hypothyroidism, unspecified: Secondary | ICD-10-CM | POA: Diagnosis not present

## 2021-06-09 DIAGNOSIS — Z9229 Personal history of other drug therapy: Secondary | ICD-10-CM | POA: Diagnosis not present

## 2021-06-09 MED ORDER — TRAMADOL HCL 50 MG PO TABS: 50.0000 mg | ORAL_TABLET | Freq: Four times a day (QID) | ORAL | 0 refills | Status: DC | PRN

## 2021-06-09 MED ORDER — SENNOSIDES-DOCUSATE SODIUM 8.6-50 MG PO TABS: 2.0000 | ORAL_TABLET | Freq: Every day | ORAL | 0 refills | Status: DC

## 2021-06-09 NOTE — Patient Instructions (Signed)
Preparing for your Surgery   Plan for surgery on June 14, 2021 with Dr. Jeral Pinch at [place] will be scheduled for robotic assisted total laparoscopic hysterectomy (removal of the uterus and cervix), bilateral salpingo-oophorectomy (removal of both ovaries and fallopian tubes), sentinel lymph node biopsy, possible lymph node dissection, possible laparotomy (larger incision on your abdomen if needed).    Pre-operative Testing -You will receive a phone call from presurgical testing at [facility] to arrange for a pre-operative appointment and lab work.   -Bring your insurance card, copy of an advanced directive if applicable, medication list   -At that visit, you will be asked to sign a consent for a possible blood transfusion in case a transfusion becomes necessary during surgery.  The need for a blood transfusion is rare but having consent is a necessary part of your care.      -You should not be taking blood thinners or aspirin at least ten days prior to surgery unless instructed by your surgeon.   -Do not take supplements such as fish oil (omega 3), red yeast rice, turmeric before your surgery. You want to avoid medications with aspirin in them including headache powders such as BC or Goody's), Excedrin migraine.   Day Before Surgery at [place] will be asked to take in a light diet the day before surgery. You will be advised you can have clear liquids up until 3 hours before your surgery.     Eat a light diet the day before surgery.  Examples including soups, broths, toast, yogurt, mashed potatoes.  AVOID GAS PRODUCING FOODS. Things to avoid include carbonated beverages (fizzy beverages, sodas), raw fruits and raw vegetables (uncooked), or beans.    If your bowels are filled with gas, your surgeon will have difficulty visualizing your pelvic organs which increases your surgical risks.   Your role in recovery Your role is to become active as soon as  directed by your doctor, while still giving yourself time to heal.  Rest when you feel tired. You will be asked to do the following in order to speed your recovery:   - Cough and breathe deeply. This helps to clear and expand your lungs and can prevent pneumonia after surgery.  - [place]. Do mild physical activity. Walking or moving your legs help your circulation and body functions return to normal. Do not try to get up or walk alone the first time after surgery.   -If you develop swelling on one leg or the other, pain in the back of your leg, redness/warmth in one of your legs, please call the office or go to the Emergency Room to have a doppler to rule out a blood clot. For shortness of breath, chest pain-seek care in the Emergency Room as soon as possible. - Actively manage your pain. Managing your pain lets you move in comfort. We will ask you to rate your pain on a scale of zero to 10. It is your responsibility to tell your doctor or nurse where and how much you hurt so your pain can be treated.   Special Considerations -If you are diabetic, you may be placed on insulin after surgery to have closer control over your blood sugars to promote healing and recovery.  This does not mean that you will be discharged on insulin.  If applicable, your oral antidiabetics will be resumed when you are tolerating a solid diet.   -Your final pathology results from  surgery should be available around one week after surgery and the results will be relayed to you when available.   -FMLA forms can be faxed to [phone] and please allow 5-7 business days for completion.   Pain Management After Surgery -You have been prescribed your pain medication and bowel regimen medications before surgery so that you can have these available when you are discharged from the hospital. The pain medication is for use ONLY AFTER surgery and a new prescription will not be given.    -Make sure that you have  Tylenol and Ibuprofen at home to use on a regular basis after surgery for pain control. We recommend alternating the medications every hour to six hours since they work differently and are processed in the body differently for pain relief.   -Review the attached handout on narcotic use and their risks and side effects.    Bowel Regimen -You have been prescribed Sennakot-S to take nightly to prevent constipation especially if you are taking the narcotic pain medication intermittently.  It is important to prevent constipation and drink adequate amounts of liquids. You can stop taking this medication when you are not taking pain medication and you are back on your normal bowel routine.   Risks of Surgery Risks of surgery are low but include bleeding, infection, damage to surrounding structures, re-operation, blood clots, and very rarely death.     Blood Transfusion Information (For the consent to be signed before surgery)   We will be checking your blood type before surgery so in case of emergencies, we will know what type of blood you would need.                                             WHAT IS A BLOOD TRANSFUSION?   A transfusion is the replacement of blood or some of its parts. Blood is made up of multiple cells which provide different functions. Red blood cells carry oxygen and are used for blood loss replacement. White blood cells fight against infection. Platelets control bleeding. Plasma helps clot blood. Other blood products are available for specialized needs, such as hemophilia or other clotting disorders. BEFORE THE TRANSFUSION  Who gives blood for transfusions?  You may be able to donate blood to be used at a later date on yourself (autologous donation). Relatives can be asked to donate blood. This is generally not any safer than if you have received blood from a stranger. The same precautions are taken to ensure safety when a relative's blood is donated. Healthy volunteers who  are fully evaluated to make sure their blood is safe. This is blood bank blood. Transfusion therapy is the safest it has ever been in the practice of medicine. Before blood is taken from a donor, a complete history is taken to make sure that person has no history of diseases nor engages in risky social behavior (examples are intravenous drug use or sexual activity with multiple partners). The donor's travel history is screened to minimize risk of transmitting infections, such as malaria. The donated blood is tested for signs of infectious diseases, such as HIV and hepatitis. The blood is then tested to be sure it is compatible with you in order to minimize the chance of a transfusion reaction. If you or a relative donates blood, this is often done in anticipation of surgery and is not appropriate for  emergency situations. It takes many days to process the donated blood. RISKS AND COMPLICATIONS Although transfusion therapy is very safe and saves many lives, the main dangers of transfusion include:  Getting an infectious disease. Developing a transfusion reaction. This is an allergic reaction to something in the blood you were given. Every precaution is taken to prevent this. The decision to have a blood transfusion has been considered carefully by your caregiver before blood is given. Blood is not given unless the benefits outweigh the risks.   AFTER SURGERY INSTRUCTIONS   Return to work: 4-6 weeks if applicable   Activity: 1. Be up and out of the bed during the day.  Take a nap if needed.  You may walk up steps but be careful and use the hand rail.  Stair climbing will tire you more than you think, you may need to stop part way and rest.    2. No lifting or straining for 6 weeks over 10 pounds. No pushing, pulling, straining for 6 weeks.   3. No driving for around 1 week(s).  Do not drive if you are taking narcotic pain medicine and make sure that your reaction time has returned.    4. You can  shower as soon as the next day after surgery. Shower daily.  Use your regular soap and water (not directly on the incision) and pat your incision(s) dry afterwards; don't rub.  No tub baths or submerging your body in water until cleared by your surgeon. If you have the soap that was given to you by pre-surgical testing that was used before surgery, you do not need to use it afterwards because this can irritate your incisions.    5. No sexual activity and nothing in the vagina for 8 weeks.   6. You may experience a small amount of clear drainage from your incisions, which is normal.  If the drainage persists, increases, or changes color please call the office.   7. Do not use creams, lotions, or ointments such as neosporin on your incisions after surgery until advised by your surgeon because they can cause removal of the dermabond glue on your incisions.     8. You may experience vaginal spotting after surgery or around the 6-8 week mark from surgery when the stitches at the top of the vagina begin to dissolve.  The spotting is normal but if you experience heavy bleeding, call our office.   9. Take Tylenol or ibuprofen first for pain and only use narcotic pain medication for severe pain not relieved by the Tylenol or Ibuprofen.  Monitor your Tylenol intake to a max of 4,000 mg in a 24 hour period. You can alternate these medications after surgery.   Diet: 1. Low sodium Heart Healthy Diet is recommended but you are cleared to resume your normal (before surgery) diet after your procedure.   2. It is safe to use a laxative, such as Miralax or Colace, if you have difficulty moving your bowels. You have been prescribed Sennakot-S to take at bedtime every evening after surgery to keep bowel movements regular and to prevent constipation.     Wound Care: 1. Keep clean and dry.  Shower daily.   Reasons to call the Doctor: Fever - Oral temperature greater than 100.4 degrees Fahrenheit Foul-smelling  vaginal discharge Difficulty urinating Nausea and vomiting Increased pain at the site of the incision that is unrelieved with pain medicine. Difficulty breathing with or without chest pain New calf pain especially if only  on one side Sudden, continuing increased vaginal bleeding with or without clots.   Contacts: For questions or concerns you should contact:   Dr. Jeral Pinch at [phone]   Joylene John, NP at [phone]   After Hours: call [phone] and have the GYN Oncologist paged/contacted (after 5 pm or on the weekends).   Messages sent via mychart are for non-urgent matters and are not responded to after hours so for urgent needs, please call the after hours number.

## 2021-06-09 NOTE — Patient Instructions (Signed)
Preparing for your Surgery  Plan for surgery on June 14, 2021 with Dr. Jeral Pinch at [place] will be scheduled for robotic assisted total laparoscopic hysterectomy (removal of the uterus and cervix), bilateral salpingo-oophorectomy (removal of both ovaries and fallopian tubes), sentinel lymph node biopsy, possible lymph node dissection, possible laparotomy (larger incision on your abdomen if needed).   Pre-operative Testing -You will receive a phone call from presurgical testing at [facility] to arrange for a pre-operative appointment and lab work.  -Bring your insurance card, copy of an advanced directive if applicable, medication list  -At that visit, you will be asked to sign a consent for a possible blood transfusion in case a transfusion becomes necessary during surgery.  The need for a blood transfusion is rare but having consent is a necessary part of your care.     -You should not be taking blood thinners or aspirin at least ten days prior to surgery unless instructed by your surgeon.  -Do not take supplements such as fish oil (omega 3), red yeast rice, turmeric before your surgery. You want to avoid medications with aspirin in them including headache powders such as BC or Goody's), Excedrin migraine.  Day Before Surgery at [place] will be asked to take in a light diet the day before surgery. You will be advised you can have clear liquids up until 3 hours before your surgery.    Eat a light diet the day before surgery.  Examples including soups, broths, toast, yogurt, mashed potatoes.  AVOID GAS PRODUCING FOODS. Things to avoid include carbonated beverages (fizzy beverages, sodas), raw fruits and raw vegetables (uncooked), or beans.   If your bowels are filled with gas, your surgeon will have difficulty visualizing your pelvic organs which increases your surgical risks.  Your role in recovery Your role is to become active as soon as directed by your  doctor, while still giving yourself time to heal.  Rest when you feel tired. You will be asked to do the following in order to speed your recovery:  - Cough and breathe deeply. This helps to clear and expand your lungs and can prevent pneumonia after surgery.  - [place]. Do mild physical activity. Walking or moving your legs help your circulation and body functions return to normal. Do not try to get up or walk alone the first time after surgery.   -If you develop swelling on one leg or the other, pain in the back of your leg, redness/warmth in one of your legs, please call the office or go to the Emergency Room to have a doppler to rule out a blood clot. For shortness of breath, chest pain-seek care in the Emergency Room as soon as possible. - Actively manage your pain. Managing your pain lets you move in comfort. We will ask you to rate your pain on a scale of zero to 10. It is your responsibility to tell your doctor or nurse where and how much you hurt so your pain can be treated.  Special Considerations -If you are diabetic, you may be placed on insulin after surgery to have closer control over your blood sugars to promote healing and recovery.  This does not mean that you will be discharged on insulin.  If applicable, your oral antidiabetics will be resumed when you are tolerating a solid diet.  -Your final pathology results from surgery should be available around one week after surgery and the results will  be relayed to you when available.  -FMLA forms can be faxed to [phone] and please allow 5-7 business days for completion.  Pain Management After Surgery -You have been prescribed your pain medication and bowel regimen medications before surgery so that you can have these available when you are discharged from the hospital. The pain medication is for use ONLY AFTER surgery and a new prescription will not be given.   -Make sure that you have Tylenol and Ibuprofen at  home to use on a regular basis after surgery for pain control. We recommend alternating the medications every hour to six hours since they work differently and are processed in the body differently for pain relief.  -Review the attached handout on narcotic use and their risks and side effects.   Bowel Regimen -You have been prescribed Sennakot-S to take nightly to prevent constipation especially if you are taking the narcotic pain medication intermittently.  It is important to prevent constipation and drink adequate amounts of liquids. You can stop taking this medication when you are not taking pain medication and you are back on your normal bowel routine.  Risks of Surgery Risks of surgery are low but include bleeding, infection, damage to surrounding structures, re-operation, blood clots, and very rarely death.   Blood Transfusion Information (For the consent to be signed before surgery)  We will be checking your blood type before surgery so in case of emergencies, we will know what type of blood you would need.                                            WHAT IS A BLOOD TRANSFUSION?  A transfusion is the replacement of blood or some of its parts. Blood is made up of multiple cells which provide different functions. Red blood cells carry oxygen and are used for blood loss replacement. White blood cells fight against infection. Platelets control bleeding. Plasma helps clot blood. Other blood products are available for specialized needs, such as hemophilia or other clotting disorders. BEFORE THE TRANSFUSION  Who gives blood for transfusions?  You may be able to donate blood to be used at a later date on yourself (autologous donation). Relatives can be asked to donate blood. This is generally not any safer than if you have received blood from a stranger. The same precautions are taken to ensure safety when a relative's blood is donated. Healthy volunteers who are fully evaluated to make sure  their blood is safe. This is blood bank blood. Transfusion therapy is the safest it has ever been in the practice of medicine. Before blood is taken from a donor, a complete history is taken to make sure that person has no history of diseases nor engages in risky social behavior (examples are intravenous drug use or sexual activity with multiple partners). The donor's travel history is screened to minimize risk of transmitting infections, such as malaria. The donated blood is tested for signs of infectious diseases, such as HIV and hepatitis. The blood is then tested to be sure it is compatible with you in order to minimize the chance of a transfusion reaction. If you or a relative donates blood, this is often done in anticipation of surgery and is not appropriate for emergency situations. It takes many days to process the donated blood. RISKS AND COMPLICATIONS Although transfusion therapy is very safe and saves many lives,  the main dangers of transfusion include:  Getting an infectious disease. Developing a transfusion reaction. This is an allergic reaction to something in the blood you were given. Every precaution is taken to prevent this. The decision to have a blood transfusion has been considered carefully by your caregiver before blood is given. Blood is not given unless the benefits outweigh the risks.  AFTER SURGERY INSTRUCTIONS  Return to work: 4-6 weeks if applicable  Activity: 1. Be up and out of the bed during the day.  Take a nap if needed.  You may walk up steps but be careful and use the hand rail.  Stair climbing will tire you more than you think, you may need to stop part way and rest.   2. No lifting or straining for 6 weeks over 10 pounds. No pushing, pulling, straining for 6 weeks.  3. No driving for around 1 week(s).  Do not drive if you are taking narcotic pain medicine and make sure that your reaction time has returned.   4. You can shower as soon as the next day after  surgery. Shower daily.  Use your regular soap and water (not directly on the incision) and pat your incision(s) dry afterwards; don't rub.  No tub baths or submerging your body in water until cleared by your surgeon. If you have the soap that was given to you by pre-surgical testing that was used before surgery, you do not need to use it afterwards because this can irritate your incisions.   5. No sexual activity and nothing in the vagina for 8 weeks.  6. You may experience a small amount of clear drainage from your incisions, which is normal.  If the drainage persists, increases, or changes color please call the office.  7. Do not use creams, lotions, or ointments such as neosporin on your incisions after surgery until advised by your surgeon because they can cause removal of the dermabond glue on your incisions.    8. You may experience vaginal spotting after surgery or around the 6-8 week mark from surgery when the stitches at the top of the vagina begin to dissolve.  The spotting is normal but if you experience heavy bleeding, call our office.  9. Take Tylenol or ibuprofen first for pain and only use narcotic pain medication for severe pain not relieved by the Tylenol or Ibuprofen.  Monitor your Tylenol intake to a max of 4,000 mg in a 24 hour period. You can alternate these medications after surgery.  Diet: 1. Low sodium Heart Healthy Diet is recommended but you are cleared to resume your normal (before surgery) diet after your procedure.  2. It is safe to use a laxative, such as Miralax or Colace, if you have difficulty moving your bowels. You have been prescribed Sennakot-S to take at bedtime every evening after surgery to keep bowel movements regular and to prevent constipation.    Wound Care: 1. Keep clean and dry.  Shower daily.  Reasons to call the Doctor: Fever - Oral temperature greater than 100.4 degrees Fahrenheit Foul-smelling vaginal discharge Difficulty urinating Nausea and  vomiting Increased pain at the site of the incision that is unrelieved with pain medicine. Difficulty breathing with or without chest pain New calf pain especially if only on one side Sudden, continuing increased vaginal bleeding with or without clots.   Contacts: For questions or concerns you should contact:  Dr. Jeral Pinch at [phone]  Joylene John, NP at [phone]  After Hours: call [phone]  and have the GYN Oncologist paged/contacted (after 5 pm or on the weekends).  Messages sent via mychart are for non-urgent matters and are not responded to after hours so for urgent needs, please call the after hours number.

## 2021-06-09 NOTE — Progress Notes (Signed)
Patient here for new patient consultation with Dr. Jeral Pinch and for a pre-operative discussion prior to her scheduled surgery on June 14, 2021. She is scheduled for robotic assisted total laparoscopic hysterectomy, bilateral salpingo-oophorectomy, sentinel lymph node biopsy, possible lymph node dissection, possible laparotomy. The surgery was discussed in detail.  See after visit summary for additional details. Visual aids used to discuss items related to surgery including sequential compression stockings, foley catheter, IV pump, multi-modal pain regimen including tylenol, photo of the surgical robot, female reproductive system to discuss surgery in detail.    ?  ?Discussed post-op pain management in detail including the aspects of the enhanced recovery pathway.  Advised her that a new prescription would be sent in for tramadol and it is only to be used for after her upcoming surgery.  We discussed the use of tylenol post-op and to monitor for a maximum of 4,000 mg in a 24 hour period.  Also prescribed sennakot to be used after surgery and to hold if having loose stools.  Discussed bowel regimen in detail.   ?  ?Discussed the use of SCDs and measures to take at home to prevent DVT including frequent mobility.  Reportable signs and symptoms of DVT discussed. Post-operative instructions discussed and expectations for after surgery. Incisional care discussed as well including reportable signs and symptoms including erythema, drainage, wound separation.  ?   ?10 minutes spent with the patient.  Verbalizing understanding of material discussed. No needs or concerns voiced at the end of the visit.   Advised patient to call for any needs.  Advised that her post-operative medications had been prescribed and could be picked up at any time.   ? ?This appointment is included in the global surgical bundle as pre-operative teaching and has no charge.     ?

## 2021-06-12 NOTE — Progress Notes (Signed)
COVID swab appointment: n/a  COVID Vaccine Completed: yes x2 Date COVID Vaccine completed: 04/21/19, 05/11/19 Has received booster: COVID vaccine manufacturer: Pfizer      Date of COVID positive in last 90 days:  PCP - Velna Hatchet, MD Cardiologist -   Chest x-ray -  EKG -  Stress Test -  ECHO -  Cardiac Cath -  Pacemaker/ICD device last checked: Spinal Cord Stimulator:  Bowel Prep - light diet day before  Sleep Study -  CPAP -   Fasting Blood Sugar -  Checks Blood Sugar _____ times a day  Blood Thinner Instructions: Aspirin Instructions: Last Dose:  Activity level:  Can go up a flight of stairs and perform activities of daily living without stopping and without symptoms of chest pain or shortness of breath.   Able to exercise without symptoms  Unable to go up a flight of stairs without symptoms of      Anesthesia review:   Patient denies shortness of breath, fever, cough and chest pain at PAT appointment   Patient verbalized understanding of instructions that were given to them at the PAT appointment. Patient was also instructed that they will need to review over the PAT instructions again at home before surgery.

## 2021-06-12 NOTE — Patient Instructions (Addendum)
DUE TO COVID-19 ONLY ONE VISITOR  (aged 75 and older)  IS ALLOWED TO COME WITH YOU AND STAY IN THE WAITING ROOM ONLY DURING PRE OP AND PROCEDURE.   **NO VISITORS ARE ALLOWED IN THE SHORT STAY AREA OR RECOVERY ROOM!!**       Your procedure is scheduled on: 06/14/21   Report to [facility] Main Entrance    Report to admitting at 10:45 AM   Call this number if you have problems the morning of surgery [phone]   Do not eat food :After Midnight.   After Midnight you may have the following liquids until 10:00 AM DAY OF SURGERY  Water Black Coffee (sugar ok, NO MILK/CREAM OR CREAMERS)  Tea (sugar ok, NO MILK/CREAM OR CREAMERS) regular and decaf                             Plain Jell-O (NO RED)                                           Fruit ices (not with fruit pulp, NO RED)                                     Popsicles (NO RED)                                                                  Juice: apple, WHITE grape, WHITE cranberry Sports drinks like Gatorade (NO RED) Clear broth(vegetable,chicken,beef)     The day of surgery:  Drink ONE (1) Pre-Surgery Clear Ensure at 10:00 AM the morning of surgery. Drink in one sitting. Do not sip.  This drink was given to you during your hospital  pre-op appointment visit. Nothing else to drink after completing the  Pre-Surgery Clear Ensure.          If you have questions, please contact your surgeons office.   FOLLOW BOWEL PREP AND ANY ADDITIONAL PRE OP INSTRUCTIONS YOU RECEIVED FROM YOUR SURGEON'S OFFICE!!!     Oral Hygiene is also important to reduce your risk of infection.                                    Remember - BRUSH YOUR TEETH THE MORNING OF SURGERY WITH YOUR REGULAR TOOTHPASTE   Do NOT smoke after Midnight   Take these medicines the morning of surgery with A SIP OF WATER: Armour Thyroid, Tramadol  Bring CPAP mask and tubing day of surgery.                              You may not have any metal on your body  including hair pins, jewelry, and body piercing             Do not wear make-up, lotions, powders, perfumes, or deodorant  Do not wear nail polish including gel and S&S, artificial/acrylic nails, or any other  type of covering on natural nails including finger and toenails. If you have artificial nails, gel coating, etc. that needs to be removed by a nail salon please have this removed prior to surgery or surgery may need to be canceled/ delayed if the surgeon/ anesthesia feels like they are unable to be safely monitored.   Do not shave  48 hours prior to surgery.     Do not bring valuables to the hospital. [place].   Contacts, dentures or bridgework may not be worn into surgery.   Patients discharged on the day of surgery will not be allowed to drive home.  Someone NEEDS to stay with you for the first 24 hours after anesthesia.   Special Instructions: Bring a copy of your healthcare power of attorney and living will documents         the day of surgery if you haven't scanned them before.              Please read over the following fact sheets you were given: IF YOU HAVE QUESTIONS ABOUT YOUR PRE-OP INSTRUCTIONS PLEASE CALL [facility] - Preparing for Surgery Before surgery, you can play an important role.  Because skin is not sterile, your skin needs to be as free of germs as possible.  You can reduce the number of germs on your skin by washing with CHG (chlorahexidine gluconate) soap before surgery.  CHG is an antiseptic cleaner which kills germs and bonds with the skin to continue killing germs even after washing. Please DO NOT use if you have an allergy to CHG or antibacterial soaps.  If your skin becomes reddened/irritated stop using the CHG and inform your nurse when you arrive at Short Stay. Do not shave (including legs and underarms) for at least 48 hours prior to the first CHG shower.  You may shave your  face/neck.  Please follow these instructions carefully:  1.  Shower with CHG Soap the night before surgery and the  morning of surgery.  2.  If you choose to wash your hair, wash your hair first as usual with your normal  shampoo.  3.  After you shampoo, rinse your hair and body thoroughly to remove the shampoo.                             4.  Use CHG as you would any other liquid soap.  You can apply chg directly to the skin and wash.  Gently with a scrungie or clean washcloth.  5.  Apply the CHG Soap to your body ONLY FROM THE NECK DOWN.   Do   not use on face/ open                           Wound or open sores. Avoid contact with eyes, ears mouth and   genitals (private parts).                       Wash face,  Genitals (private parts) with your normal soap.             6.  Wash thoroughly, paying special attention to the area where your    surgery  will be performed.  7.  Thoroughly rinse your  body with warm water from the neck down.  8.  DO NOT shower/wash with your normal soap after using and rinsing off the CHG Soap.                9.  Pat yourself dry with a clean towel.            10.  Wear clean pajamas.            11.  Place clean sheets on your bed the night of your first shower and do not  sleep with pets. Day of Surgery : Do not apply any lotions/deodorants the morning of surgery.  Please wear clean clothes to the hospital/surgery center.  FAILURE TO FOLLOW THESE INSTRUCTIONS MAY RESULT IN THE CANCELLATION OF YOUR SURGERY  PATIENT SIGNATURE_________________________________  NURSE SIGNATURE__________________________________  ________________________________________________________________________  WHAT IS A BLOOD TRANSFUSION? Blood Transfusion Information  A transfusion is the replacement of blood or some of its parts. Blood is made up of multiple cells which provide different functions. Red blood cells carry oxygen and are used for blood loss replacement. White blood cells  fight against infection. Platelets control bleeding. Plasma helps clot blood. Other blood products are available for specialized needs, such as hemophilia or other clotting disorders. BEFORE THE TRANSFUSION  Who gives blood for transfusions?  Healthy volunteers who are fully evaluated to make sure their blood is safe. This is blood bank blood. Transfusion therapy is the safest it has ever been in the practice of medicine. Before blood is taken from a donor, a complete history is taken to make sure that person has no history of diseases nor engages in risky social behavior (examples are intravenous drug use or sexual activity with multiple partners). The donor's travel history is screened to minimize risk of transmitting infections, such as malaria. The donated blood is tested for signs of infectious diseases, such as HIV and hepatitis. The blood is then tested to be sure it is compatible with you in order to minimize the chance of a transfusion reaction. If you or a relative donates blood, this is often done in anticipation of surgery and is not appropriate for emergency situations. It takes many days to process the donated blood. RISKS AND COMPLICATIONS Although transfusion therapy is very safe and saves many lives, the main dangers of transfusion include:  Getting an infectious disease. Developing a transfusion reaction. This is an allergic reaction to something in the blood you were given. Every precaution is taken to prevent this. The decision to have a blood transfusion has been considered carefully by your caregiver before blood is given. Blood is not given unless the benefits outweigh the risks. AFTER THE TRANSFUSION Right after receiving a blood transfusion, you will usually feel much better and more energetic. This is especially true if your red blood cells have gotten low (anemic). The transfusion raises the level of the red blood cells which carry oxygen, and this usually causes an energy  increase. The nurse administering the transfusion will monitor you carefully for complications. HOME CARE INSTRUCTIONS  No special instructions are needed after a transfusion. You may find your energy is better. Speak with your caregiver about any limitations on activity for underlying diseases you may have. SEEK MEDICAL CARE IF:  Your condition is not improving after your transfusion. You develop redness or irritation at the intravenous (IV) site. SEEK IMMEDIATE MEDICAL CARE IF:  Any of the following symptoms occur over the next 12 hours: Shaking chills. You have a temperature  by mouth above 102 F (38.9 C), not controlled by medicine. Chest, back, or muscle pain. People around you feel you are not acting correctly or are confused. Shortness of breath or difficulty breathing. Dizziness and fainting. You get a rash or develop hives. You have a decrease in urine output. Your urine turns a dark color or changes to pink, red, or brown. Any of the following symptoms occur over the next 10 days: You have a temperature by mouth above 102 F (38.9 C), not controlled by medicine. Shortness of breath. Weakness after normal activity. The white part of the eye turns yellow (jaundice). You have a decrease in the amount of urine or are urinating less often. Your urine turns a dark color or changes to pink, red, or brown. Document Released: 03/16/2000 Document Revised: 06/11/2011 Document Reviewed: 11/03/2007 [facility] Patient Information 2014 [place], [place].  _______________________________________________________________________

## 2021-06-13 ENCOUNTER — Telehealth: Payer: Self-pay | Admitting: *Deleted

## 2021-06-13 ENCOUNTER — Other Ambulatory Visit: Payer: Self-pay

## 2021-06-13 ENCOUNTER — Encounter (HOSPITAL_COMMUNITY)
Admission: RE | Admit: 2021-06-13 | Discharge: 2021-06-13 | Disposition: A | Discharge status: Home / Self Care | Payer: Medicare HMO | Source: Ambulatory Visit | Attending: Gynecologic Oncology | Admitting: Gynecologic Oncology

## 2021-06-13 ENCOUNTER — Encounter (HOSPITAL_COMMUNITY): Payer: Self-pay

## 2021-06-13 DIAGNOSIS — J439 Emphysema, unspecified: Secondary | ICD-10-CM | POA: Diagnosis not present

## 2021-06-13 DIAGNOSIS — D62 Acute posthemorrhagic anemia: Secondary | ICD-10-CM | POA: Diagnosis not present

## 2021-06-13 DIAGNOSIS — D649 Anemia, unspecified: Secondary | ICD-10-CM | POA: Diagnosis not present

## 2021-06-13 DIAGNOSIS — Z01812 Encounter for preprocedural laboratory examination: Secondary | ICD-10-CM | POA: Insufficient documentation

## 2021-06-13 DIAGNOSIS — R55 Syncope and collapse: Secondary | ICD-10-CM | POA: Diagnosis not present

## 2021-06-13 DIAGNOSIS — N8502 Endometrial intraepithelial neoplasia [EIN]: Secondary | ICD-10-CM

## 2021-06-13 DIAGNOSIS — I959 Hypotension, unspecified: Secondary | ICD-10-CM | POA: Diagnosis not present

## 2021-06-13 LAB — COMPREHENSIVE METABOLIC PANEL
ALT: 18 U/L (ref 0–44)
AST: 19 U/L (ref 15–41)
Albumin: 4.1 g/dL (ref 3.5–5.0)
Alkaline Phosphatase: 56 U/L (ref 38–126)
Anion gap: 6 (ref 5–15)
BUN: 12 mg/dL (ref 8–23)
CO2: 27 mmol/L (ref 22–32)
Calcium: 9.2 mg/dL (ref 8.9–10.3)
Chloride: 103 mmol/L (ref 98–111)
Creatinine, Ser: 0.62 mg/dL (ref 0.44–1.00)
GFR, Estimated: >60 mL/min (ref >60)
Glucose, Bld: 101 mg/dL — ABNORMAL HIGH (ref 70–99)
Potassium: 4.7 mmol/L (ref 3.5–5.1)
Sodium: 136 mmol/L (ref 135–145)
Total Bilirubin: 0.4 mg/dL (ref 0.3–1.2)
Total Protein: 7.5 g/dL (ref 6.5–8.1)

## 2021-06-13 LAB — CBC
HCT: 41.9 % (ref 36.0–46.0)
Hemoglobin: 13.9 g/dL (ref 12.0–15.0)
MCH: 30.3 pg (ref 26.0–34.0)
MCHC: 33.2 g/dL (ref 30.0–36.0)
MCV: 91.5 fL (ref 80.0–100.0)
Platelets: 270 10*3/uL (ref 150–400)
RBC: 4.58 MIL/uL (ref 3.87–5.11)
RDW: 13.3 % (ref 11.5–15.5)
WBC: 6.9 10*3/uL (ref 4.0–10.5)
nRBC: 0 % (ref 0.0–0.2)

## 2021-06-13 NOTE — Telephone Encounter (Signed)
Telephone call to check on pre-operative status.  Patient compliant with pre-operative instructions.  Reinforced nothing to eat after midnight. Clear liquids until 1000. Patient to arrive at 1045.  Pt stated concern for nausea post surgery and was wondering if we can prescribe something for her. Lenna Sciara, NP notified. No further questions or concerns voiced.  Instructed to call for any needs.  ?

## 2021-06-13 NOTE — Anesthesia Preprocedure Evaluation (Addendum)
Anesthesia Evaluation  ? ?Patient awake ? ? ? ?Reviewed: ?Allergy & Precautions, NPO status , Patient's Chart, lab work & pertinent test results ? ?Airway ?Mallampati: III ? ?TM Distance: >3 FB ?Neck ROM: Full ? ? ? Dental ? ?(+) Dental Advisory Given ?  ?Pulmonary ?former smoker,  ?  ?breath sounds clear to auscultation ? ? ? ? ? ? Cardiovascular ?negative cardio ROS ? ? ?Rhythm:Regular Rate:Normal ? ? ?  ?Neuro/Psych ?negative neurological ROS ?   ? GI/Hepatic ?negative GI ROS, Neg liver ROS, IBS ?  ?Endo/Other  ?negative endocrine ROS ? Renal/GU ?negative Renal ROSLab Results ?     Component                Value               Date                 ?     CREATININE               0.62                06/13/2021           ?     BUN                      12                  06/13/2021           ?     NA                       136                 06/13/2021           ?     K                        4.7                 06/13/2021           ?     CL                       103                 06/13/2021           ?     CO2                      27                  06/13/2021           ?  ? ?  ?Musculoskeletal ? ?(+) Fibromyalgia - ? Abdominal ?  ?Peds ? Hematology ?negative hematology ROS ?(+) Lab Results ?     Component                Value               Date                 ?     WBC                      6.9  06/13/2021           ?     HGB                      13.9                06/13/2021           ?     HCT                      41.9                06/13/2021           ?     MCV                      91.5                06/13/2021           ?     PLT                      270                 06/13/2021           ?   ?Anesthesia Other Findings ?All: sulfa, hydrocodone ? Reproductive/Obstetrics ? ?  ? ? ? ? ? ? ? ? ? ? ? ? ? ?  ?  ? ? ? ? ? ? ? ?Anesthesia Physical ?Anesthesia Plan ? ?ASA: 2 ? ?Anesthesia Plan: General  ? ?Post-op Pain Management: Lidocaine infusion*, Tylenol PO  (pre-op)*, Toradol IV (intra-op)* and Precedex  ? ?Induction: Intravenous ? ?PONV Risk Score and Plan: 4 or greater and Treatment may vary due to age or medical condition, Midazolam, Ondansetron and Dexamethasone ? ?Airway Management Planned: Oral ETT ? ?Additional Equipment: None ? ?Intra-op Plan:  ? ?Post-operative Plan: Extubation in OR ? ?Informed Consent: I have reviewed the patients History and Physical, chart, labs and discussed the procedure including the risks, benefits and alternatives for the proposed anesthesia with the patient or authorized representative who has indicated his/her understanding and acceptance.  ? ? ? ?Dental advisory given ? ?Plan Discussed with: CRNA ? ?Anesthesia Plan Comments:   ? ? ? ? ? ?Anesthesia Quick Evaluation ? ?

## 2021-06-13 NOTE — Telephone Encounter (Signed)
Spoke with pt this afternoon regarding her concerns about nausea post sx. Explained to the pt Per Lenna Sciara, NP we typically don't prescribe nausea medication post sx due to the slow mobility of the bowels already due to the anaesthesia. If pt does experience nausea to call the office. Pt verbalized understanding and did not voice any other concerns.   ?

## 2021-06-13 NOTE — Telephone Encounter (Signed)
Attempted to review pre op information with patient. LVM for a call back.  ?

## 2021-06-14 ENCOUNTER — Emergency Department (HOSPITAL_COMMUNITY): Payer: Medicare HMO

## 2021-06-14 ENCOUNTER — Encounter (HOSPITAL_COMMUNITY): Payer: Self-pay | Admitting: Gynecologic Oncology

## 2021-06-14 ENCOUNTER — Other Ambulatory Visit: Payer: Self-pay

## 2021-06-14 ENCOUNTER — Ambulatory Visit (HOSPITAL_BASED_OUTPATIENT_CLINIC_OR_DEPARTMENT_OTHER)
Admission: RE | Admit: 2021-06-14 | Discharge: 2021-06-14 | Disposition: A | Discharge status: Home / Self Care | Payer: Medicare HMO | Source: Home / Self Care | Attending: Gynecologic Oncology | Admitting: Gynecologic Oncology

## 2021-06-14 ENCOUNTER — Inpatient Hospital Stay (HOSPITAL_COMMUNITY)
Admission: EM | Admit: 2021-06-14 | Discharge: 2021-06-18 | DRG: 804 | Disposition: A | Discharge status: Home / Self Care | Payer: Medicare HMO | Attending: Gynecologic Oncology | Admitting: Gynecologic Oncology

## 2021-06-14 ENCOUNTER — Encounter (HOSPITAL_COMMUNITY)
Admission: RE | Disposition: A | Discharge status: Home / Self Care | Payer: Self-pay | Source: Home / Self Care | Attending: Gynecologic Oncology

## 2021-06-14 ENCOUNTER — Ambulatory Visit (HOSPITAL_BASED_OUTPATIENT_CLINIC_OR_DEPARTMENT_OTHER): Payer: Medicare HMO | Admitting: Anesthesiology

## 2021-06-14 ENCOUNTER — Encounter (HOSPITAL_COMMUNITY): Payer: Self-pay

## 2021-06-14 ENCOUNTER — Ambulatory Visit (HOSPITAL_COMMUNITY): Payer: Medicare HMO | Admitting: Anesthesiology

## 2021-06-14 DIAGNOSIS — N838 Other noninflammatory disorders of ovary, fallopian tube and broad ligament: Secondary | ICD-10-CM | POA: Diagnosis not present

## 2021-06-14 DIAGNOSIS — Z87891 Personal history of nicotine dependence: Secondary | ICD-10-CM | POA: Insufficient documentation

## 2021-06-14 DIAGNOSIS — M797 Fibromyalgia: Secondary | ICD-10-CM | POA: Diagnosis present

## 2021-06-14 DIAGNOSIS — G4489 Other headache syndrome: Secondary | ICD-10-CM

## 2021-06-14 DIAGNOSIS — R112 Nausea with vomiting, unspecified: Secondary | ICD-10-CM

## 2021-06-14 DIAGNOSIS — I959 Hypotension, unspecified: Secondary | ICD-10-CM | POA: Diagnosis not present

## 2021-06-14 DIAGNOSIS — Z743 Need for continuous supervision: Secondary | ICD-10-CM | POA: Diagnosis not present

## 2021-06-14 DIAGNOSIS — R55 Syncope and collapse: Secondary | ICD-10-CM | POA: Diagnosis present

## 2021-06-14 DIAGNOSIS — R053 Chronic cough: Secondary | ICD-10-CM | POA: Diagnosis present

## 2021-06-14 DIAGNOSIS — D62 Acute posthemorrhagic anemia: Principal | ICD-10-CM | POA: Diagnosis present

## 2021-06-14 DIAGNOSIS — Z79899 Other long term (current) drug therapy: Secondary | ICD-10-CM

## 2021-06-14 DIAGNOSIS — N8502 Endometrial intraepithelial neoplasia [EIN]: Secondary | ICD-10-CM | POA: Diagnosis not present

## 2021-06-14 DIAGNOSIS — J439 Emphysema, unspecified: Secondary | ICD-10-CM | POA: Diagnosis not present

## 2021-06-14 DIAGNOSIS — E039 Hypothyroidism, unspecified: Secondary | ICD-10-CM | POA: Diagnosis present

## 2021-06-14 DIAGNOSIS — S3141XA Laceration without foreign body of vagina and vulva, initial encounter: Secondary | ICD-10-CM | POA: Diagnosis not present

## 2021-06-14 DIAGNOSIS — Z20822 Contact with and (suspected) exposure to covid-19: Secondary | ICD-10-CM | POA: Diagnosis present

## 2021-06-14 DIAGNOSIS — D259 Leiomyoma of uterus, unspecified: Secondary | ICD-10-CM | POA: Insufficient documentation

## 2021-06-14 DIAGNOSIS — Z780 Asymptomatic menopausal state: Secondary | ICD-10-CM

## 2021-06-14 DIAGNOSIS — Z882 Allergy status to sulfonamides status: Secondary | ICD-10-CM

## 2021-06-14 DIAGNOSIS — K649 Unspecified hemorrhoids: Secondary | ICD-10-CM | POA: Diagnosis present

## 2021-06-14 DIAGNOSIS — R110 Nausea: Secondary | ICD-10-CM | POA: Diagnosis not present

## 2021-06-14 DIAGNOSIS — R0982 Postnasal drip: Secondary | ICD-10-CM | POA: Insufficient documentation

## 2021-06-14 DIAGNOSIS — K589 Irritable bowel syndrome without diarrhea: Secondary | ICD-10-CM | POA: Diagnosis present

## 2021-06-14 DIAGNOSIS — Z885 Allergy status to narcotic agent status: Secondary | ICD-10-CM

## 2021-06-14 DIAGNOSIS — R42 Dizziness and giddiness: Secondary | ICD-10-CM | POA: Diagnosis not present

## 2021-06-14 DIAGNOSIS — Z800 Family history of malignant neoplasm of digestive organs: Secondary | ICD-10-CM | POA: Insufficient documentation

## 2021-06-14 DIAGNOSIS — Y836 Removal of other organ (partial) (total) as the cause of abnormal reaction of the patient, or of later complication, without mention of misadventure at the time of the procedure: Secondary | ICD-10-CM | POA: Diagnosis not present

## 2021-06-14 DIAGNOSIS — R350 Frequency of micturition: Secondary | ICD-10-CM | POA: Diagnosis present

## 2021-06-14 DIAGNOSIS — Z79890 Hormone replacement therapy (postmenopausal): Secondary | ICD-10-CM

## 2021-06-14 DIAGNOSIS — R5382 Chronic fatigue, unspecified: Secondary | ICD-10-CM | POA: Diagnosis present

## 2021-06-14 DIAGNOSIS — D270 Benign neoplasm of right ovary: Secondary | ICD-10-CM | POA: Diagnosis not present

## 2021-06-14 DIAGNOSIS — Z83438 Family history of other disorder of lipoprotein metabolism and other lipidemia: Secondary | ICD-10-CM

## 2021-06-14 DIAGNOSIS — D251 Intramural leiomyoma of uterus: Secondary | ICD-10-CM | POA: Diagnosis not present

## 2021-06-14 DIAGNOSIS — D252 Subserosal leiomyoma of uterus: Secondary | ICD-10-CM | POA: Diagnosis not present

## 2021-06-14 DIAGNOSIS — E785 Hyperlipidemia, unspecified: Secondary | ICD-10-CM | POA: Diagnosis present

## 2021-06-14 DIAGNOSIS — D649 Anemia, unspecified: Secondary | ICD-10-CM | POA: Diagnosis not present

## 2021-06-14 DIAGNOSIS — Y92234 Operating room of hospital as the place of occurrence of the external cause: Secondary | ICD-10-CM | POA: Diagnosis not present

## 2021-06-14 DIAGNOSIS — Z9049 Acquired absence of other specified parts of digestive tract: Secondary | ICD-10-CM

## 2021-06-14 LAB — ABO/RH: ABO/RH(D): O NEG

## 2021-06-14 SURGERY — HYSTERECTOMY, TOTAL, ROBOT-ASSISTED, LAPAROSCOPIC, WITH BILATERAL SALPINGO-OOPHORECTOMY
Anesthesia: General

## 2021-06-14 MED ORDER — DEXAMETHASONE SODIUM PHOSPHATE 10 MG/ML IJ SOLN
INTRAMUSCULAR | Status: DC | PRN
  Administered 2021-06-14: 4 mg via INTRAVENOUS

## 2021-06-14 MED ORDER — LACTATED RINGERS IV SOLN: INTRAVENOUS | Status: DC

## 2021-06-14 MED ORDER — ROCURONIUM BROMIDE 10 MG/ML (PF) SYRINGE
PREFILLED_SYRINGE | INTRAVENOUS | Status: AC
  Filled 2021-06-14: qty 10

## 2021-06-14 MED ORDER — HYDROMORPHONE HCL 1 MG/ML IJ SOLN: 0.2500 mg | INTRAMUSCULAR | Status: DC | PRN

## 2021-06-14 MED ORDER — EPHEDRINE SULFATE-NACL 50-0.9 MG/10ML-% IV SOSY
PREFILLED_SYRINGE | INTRAVENOUS | Status: DC | PRN
  Administered 2021-06-14 (×2): 10 mg via INTRAVENOUS

## 2021-06-14 MED ORDER — OXYCODONE HCL 5 MG/5ML PO SOLN: 5.0000 mg | Freq: Once | ORAL | Status: DC | PRN

## 2021-06-14 MED ORDER — DEXMEDETOMIDINE (PRECEDEX) IN NS 20 MCG/5ML (4 MCG/ML) IV SYRINGE
PREFILLED_SYRINGE | INTRAVENOUS | Status: DC | PRN
  Administered 2021-06-14: 8 ug via INTRAVENOUS
  Administered 2021-06-14: 4 ug via INTRAVENOUS

## 2021-06-14 MED ORDER — STERILE WATER FOR IRRIGATION IR SOLN
Status: DC | PRN
Start: 2021-06-14 — End: 2021-06-14
  Administered 2021-06-14: 3000 mL

## 2021-06-14 MED ORDER — BUPIVACAINE HCL 0.25 % IJ SOLN
INTRAMUSCULAR | Status: DC | PRN
  Administered 2021-06-14: 26 mL

## 2021-06-14 MED ORDER — CEFAZOLIN SODIUM-DEXTROSE 2-4 GM/100ML-% IV SOLN
2.0000 g | INTRAVENOUS | Status: AC
  Administered 2021-06-14: 2 g via INTRAVENOUS
  Filled 2021-06-14: qty 100

## 2021-06-14 MED ORDER — ONDANSETRON HCL 4 MG/2ML IJ SOLN: 4.0000 mg | Freq: Once | INTRAMUSCULAR | Status: DC | PRN

## 2021-06-14 MED ORDER — KETOROLAC TROMETHAMINE 30 MG/ML IJ SOLN
15.0000 mg | Freq: Once | INTRAMUSCULAR | Status: AC | PRN
  Administered 2021-06-14: 15 mg via INTRAVENOUS

## 2021-06-14 MED ORDER — DEXAMETHASONE SODIUM PHOSPHATE 4 MG/ML IJ SOLN
4.0000 mg | INTRAMUSCULAR | Status: DC
  Filled 2021-06-14: qty 1

## 2021-06-14 MED ORDER — MIDAZOLAM HCL 2 MG/2ML IJ SOLN
INTRAMUSCULAR | Status: AC
  Filled 2021-06-14: qty 2

## 2021-06-14 MED ORDER — BUPIVACAINE HCL 0.25 % IJ SOLN
INTRAMUSCULAR | Status: AC
  Filled 2021-06-14: qty 1

## 2021-06-14 MED ORDER — ONDANSETRON HCL 4 MG/2ML IJ SOLN
INTRAMUSCULAR | Status: AC
  Filled 2021-06-14: qty 2

## 2021-06-14 MED ORDER — OXYCODONE HCL 5 MG PO TABS: 5.0000 mg | ORAL_TABLET | Freq: Once | ORAL | Status: DC | PRN

## 2021-06-14 MED ORDER — FENTANYL CITRATE (PF) 100 MCG/2ML IJ SOLN
INTRAMUSCULAR | Status: AC
  Filled 2021-06-14: qty 2

## 2021-06-14 MED ORDER — DEXAMETHASONE SODIUM PHOSPHATE 10 MG/ML IJ SOLN
INTRAMUSCULAR | Status: AC
  Filled 2021-06-14: qty 1

## 2021-06-14 MED ORDER — LIDOCAINE HCL (PF) 2 % IJ SOLN
INTRAMUSCULAR | Status: AC
  Filled 2021-06-14: qty 20

## 2021-06-14 MED ORDER — ACETAMINOPHEN 500 MG PO TABS
1000.0000 mg | ORAL_TABLET | ORAL | Status: AC
  Administered 2021-06-14: 1000 mg via ORAL
  Filled 2021-06-14: qty 2

## 2021-06-14 MED ORDER — PROPOFOL 10 MG/ML IV BOLUS
INTRAVENOUS | Status: AC
  Filled 2021-06-14: qty 20

## 2021-06-14 MED ORDER — ROCURONIUM BROMIDE 10 MG/ML (PF) SYRINGE
PREFILLED_SYRINGE | INTRAVENOUS | Status: DC | PRN
  Administered 2021-06-14: 20 mg via INTRAVENOUS
  Administered 2021-06-14: 50 mg via INTRAVENOUS
  Administered 2021-06-14: 20 mg via INTRAVENOUS
  Administered 2021-06-14: 10 mg via INTRAVENOUS
  Administered 2021-06-14: 20 mg via INTRAVENOUS

## 2021-06-14 MED ORDER — PHENYLEPHRINE 40 MCG/ML (10ML) SYRINGE FOR IV PUSH (FOR BLOOD PRESSURE SUPPORT)
PREFILLED_SYRINGE | INTRAVENOUS | Status: DC | PRN
  Administered 2021-06-14 (×2): 40 ug via INTRAVENOUS
  Administered 2021-06-14: 80 ug via INTRAVENOUS
  Administered 2021-06-14: 40 ug via INTRAVENOUS

## 2021-06-14 MED ORDER — STERILE WATER FOR INJECTION IJ SOLN
INTRAMUSCULAR | Status: DC | PRN
  Administered 2021-06-14: 4 mL

## 2021-06-14 MED ORDER — SUGAMMADEX SODIUM 200 MG/2ML IV SOLN
INTRAVENOUS | Status: DC | PRN
  Administered 2021-06-14: 150 mg via INTRAVENOUS

## 2021-06-14 MED ORDER — ENSURE PRE-SURGERY PO LIQD: 296.0000 mL | Freq: Once | ORAL | Status: DC

## 2021-06-14 MED ORDER — LACTATED RINGERS IR SOLN
Status: DC | PRN
  Administered 2021-06-14: 1000 mL

## 2021-06-14 MED ORDER — LIDOCAINE 2% (20 MG/ML) 5 ML SYRINGE
INTRAMUSCULAR | Status: DC | PRN
Start: 2021-06-14 — End: 2021-06-14
  Administered 2021-06-14: 70 mg via INTRAVENOUS

## 2021-06-14 MED ORDER — PHENYLEPHRINE HCL-NACL 20-0.9 MG/250ML-% IV SOLN
INTRAVENOUS | Status: DC | PRN
  Administered 2021-06-14: 20 ug/min via INTRAVENOUS

## 2021-06-14 MED ORDER — ORAL CARE MOUTH RINSE: 15.0000 mL | Freq: Once | OROMUCOSAL | Status: AC

## 2021-06-14 MED ORDER — HEPARIN SODIUM (PORCINE) 5000 UNIT/ML IJ SOLN
5000.0000 [IU] | INTRAMUSCULAR | Status: AC
  Administered 2021-06-14: 5000 [IU] via SUBCUTANEOUS
  Filled 2021-06-14: qty 1

## 2021-06-14 MED ORDER — PHENYLEPHRINE 40 MCG/ML (10ML) SYRINGE FOR IV PUSH (FOR BLOOD PRESSURE SUPPORT)
PREFILLED_SYRINGE | INTRAVENOUS | Status: AC
  Filled 2021-06-14: qty 10

## 2021-06-14 MED ORDER — LACTATED RINGERS IV SOLN: INTRAVENOUS | Status: DC | PRN

## 2021-06-14 MED ORDER — FENTANYL CITRATE (PF) 250 MCG/5ML IJ SOLN
INTRAMUSCULAR | Status: DC | PRN
Start: 2021-06-14 — End: 2021-06-14
  Administered 2021-06-14 (×3): 50 ug via INTRAVENOUS

## 2021-06-14 MED ORDER — LIDOCAINE 2% (20 MG/ML) 5 ML SYRINGE
INTRAMUSCULAR | Status: DC | PRN
  Administered 2021-06-14: 1.5 mg/kg/h via INTRAVENOUS

## 2021-06-14 MED ORDER — STERILE WATER FOR INJECTION IJ SOLN
INTRAMUSCULAR | Status: DC | PRN
  Administered 2021-06-14: 10 mL

## 2021-06-14 MED ORDER — CHLORHEXIDINE GLUCONATE 0.12 % MT SOLN
15.0000 mL | Freq: Once | OROMUCOSAL | Status: AC
  Administered 2021-06-14: 15 mL via OROMUCOSAL

## 2021-06-14 MED ORDER — STERILE WATER FOR IRRIGATION IR SOLN
Status: DC | PRN
  Administered 2021-06-14: 1000 mL

## 2021-06-14 MED ORDER — MIDAZOLAM HCL 2 MG/2ML IJ SOLN
INTRAMUSCULAR | Status: DC | PRN
  Administered 2021-06-14 (×2): 1 mg via INTRAVENOUS

## 2021-06-14 MED ORDER — EPHEDRINE 5 MG/ML INJ
INTRAVENOUS | Status: AC
  Filled 2021-06-14: qty 5

## 2021-06-14 MED ORDER — PROPOFOL 10 MG/ML IV BOLUS
INTRAVENOUS | Status: DC | PRN
  Administered 2021-06-14: 150 mg via INTRAVENOUS

## 2021-06-14 SURGICAL SUPPLY — 78 items
ADH SKN CLS APL DERMABOND .7 (GAUZE/BANDAGES/DRESSINGS) ×1
AGENT HMST KT MTR STRL THRMB (HEMOSTASIS)
APL ESCP 34 STRL LF DISP (HEMOSTASIS)
APL SRG 38 LTWT LNG FL B (MISCELLANEOUS) ×1
APPLICATOR ARISTA FLEXITIP XL (MISCELLANEOUS) ×1 IMPLANT
APPLICATOR SURGIFLO ENDO (HEMOSTASIS) IMPLANT
BACTOSHIELD CHG 4% 4OZ (MISCELLANEOUS) ×1
BAG COUNTER SPONGE SURGICOUNT (BAG) IMPLANT
BAG LAPAROSCOPIC 12 15 PORT 16 (BASKET) IMPLANT
BAG RETRIEVAL 12/15 (BASKET) ×2
BAG SPEC RTRVL LRG 6X4 10 (ENDOMECHANICALS)
BAG SPNG CNTER NS LX DISP (BAG)
BLADE SURG SZ10 CARB STEEL (BLADE) IMPLANT
COVER BACK TABLE 60X90IN (DRAPES) ×3 IMPLANT
COVER TIP SHEARS 8 DVNC (MISCELLANEOUS) ×2 IMPLANT
COVER TIP SHEARS 8MM DA VINCI (MISCELLANEOUS) ×2
DERMABOND ADVANCED (GAUZE/BANDAGES/DRESSINGS) ×1
DERMABOND ADVANCED .7 DNX12 (GAUZE/BANDAGES/DRESSINGS) ×2 IMPLANT
DRAPE ARM DVNC X/XI (DISPOSABLE) ×8 IMPLANT
DRAPE COLUMN DVNC XI (DISPOSABLE) ×2 IMPLANT
DRAPE DA VINCI XI ARM (DISPOSABLE) ×8
DRAPE DA VINCI XI COLUMN (DISPOSABLE) ×2
DRAPE SHEET LG 3/4 BI-LAMINATE (DRAPES) ×3 IMPLANT
DRAPE SURG IRRIG POUCH 19X23 (DRAPES) ×3 IMPLANT
DRSG OPSITE POSTOP 4X6 (GAUZE/BANDAGES/DRESSINGS) IMPLANT
DRSG OPSITE POSTOP 4X8 (GAUZE/BANDAGES/DRESSINGS) IMPLANT
ELECT PENCIL ROCKER SW 15FT (MISCELLANEOUS) IMPLANT
ELECT REM PT RETURN 15FT ADLT (MISCELLANEOUS) ×3 IMPLANT
GAUZE 4X4 16PLY ~~LOC~~+RFID DBL (SPONGE) ×3 IMPLANT
GLOVE SURG ENC MOIS LTX SZ6 (GLOVE) ×12 IMPLANT
GLOVE SURG ENC MOIS LTX SZ6.5 (GLOVE) ×6 IMPLANT
GOWN STRL REUS W/ TWL LRG LVL3 (GOWN DISPOSABLE) ×8 IMPLANT
GOWN STRL REUS W/TWL LRG LVL3 (GOWN DISPOSABLE) ×8
HEMOSTAT ARISTA ABSORB 3G PWDR (HEMOSTASIS) ×1 IMPLANT
HOLDER FOLEY CATH W/STRAP (MISCELLANEOUS) IMPLANT
IRRIG SUCT STRYKERFLOW 2 WTIP (MISCELLANEOUS) ×2
IRRIGATION SUCT STRKRFLW 2 WTP (MISCELLANEOUS) ×2 IMPLANT
KIT PROCEDURE DA VINCI SI (MISCELLANEOUS)
KIT PROCEDURE DVNC SI (MISCELLANEOUS) IMPLANT
KIT TURNOVER KIT A (KITS) IMPLANT
MANIPULATOR ADVINCU DEL 3.0 PL (MISCELLANEOUS) IMPLANT
MANIPULATOR ADVINCU DEL 3.5 PL (MISCELLANEOUS) IMPLANT
MANIPULATOR UTERINE 4.5 ZUMI (MISCELLANEOUS) ×1 IMPLANT
NDL HYPO 21X1.5 SAFETY (NEEDLE) ×2 IMPLANT
NDL SPNL 18GX3.5 QUINCKE PK (NEEDLE) IMPLANT
NEEDLE HYPO 21X1.5 SAFETY (NEEDLE) ×2 IMPLANT
NEEDLE SPNL 18GX3.5 QUINCKE PK (NEEDLE) IMPLANT
OBTURATOR OPTICAL STANDARD 8MM (TROCAR) ×2
OBTURATOR OPTICAL STND 8 DVNC (TROCAR) ×1
OBTURATOR OPTICALSTD 8 DVNC (TROCAR) ×2 IMPLANT
PACK ROBOT GYN CUSTOM WL (TRAY / TRAY PROCEDURE) ×3 IMPLANT
PAD POSITIONING PINK XL (MISCELLANEOUS) ×3 IMPLANT
PORT ACCESS TROCAR AIRSEAL 12 (TROCAR) ×2 IMPLANT
PORT ACCESS TROCAR AIRSEAL 5M (TROCAR) ×1
POUCH SPECIMEN RETRIEVAL 10MM (ENDOMECHANICALS) IMPLANT
SCRUB CHG 4% DYNA-HEX 4OZ (MISCELLANEOUS) ×2 IMPLANT
SEAL CANN UNIV 5-8 DVNC XI (MISCELLANEOUS) ×8 IMPLANT
SEAL XI 5MM-8MM UNIVERSAL (MISCELLANEOUS) ×8
SET IRRIG Y TYPE TUR BLADDER L (SET/KITS/TRAYS/PACK) ×1 IMPLANT
SET TRI-LUMEN FLTR TB AIRSEAL (TUBING) ×3 IMPLANT
SPIKE FLUID TRANSFER (MISCELLANEOUS) ×4 IMPLANT
SPONGE T-LAP 18X18 ~~LOC~~+RFID (SPONGE) IMPLANT
SURGIFLO W/THROMBIN 8M KIT (HEMOSTASIS) IMPLANT
SUT MNCRL AB 4-0 PS2 18 (SUTURE) IMPLANT
SUT PDS AB 1 TP1 96 (SUTURE) IMPLANT
SUT VIC AB 0 CT1 27 (SUTURE) ×2
SUT VIC AB 0 CT1 27XBRD ANTBC (SUTURE) IMPLANT
SUT VIC AB 2-0 CT1 27 (SUTURE)
SUT VIC AB 2-0 CT1 TAPERPNT 27 (SUTURE) IMPLANT
SUT VIC AB 4-0 PS2 18 (SUTURE) ×6 IMPLANT
SYR 10ML LL (SYRINGE) IMPLANT
TOWEL OR NON WOVEN STRL DISP B (DISPOSABLE) IMPLANT
TRAP SPECIMEN MUCUS 40CC (MISCELLANEOUS) IMPLANT
TRAY FOLEY MTR SLVR 16FR STAT (SET/KITS/TRAYS/PACK) ×3 IMPLANT
TROCAR XCEL NON-BLD 5MMX100MML (ENDOMECHANICALS) IMPLANT
UNDERPAD 30X36 HEAVY ABSORB (UNDERPADS AND DIAPERS) ×6 IMPLANT
WATER STERILE IRR 1000ML POUR (IV SOLUTION) ×3 IMPLANT
YANKAUER SUCT BULB TIP 10FT TU (MISCELLANEOUS) IMPLANT

## 2021-06-14 NOTE — Discharge Instructions (Signed)
AFTER SURGERY INSTRUCTIONS ?  ?Return to work: 4-6 weeks if applicable ?  ?Activity: ?1. Be up and out of the bed during the day.  Take a nap if needed.  You may walk up steps but be careful and use the hand rail.  Stair climbing will tire you more than you think, you may need to stop part way and rest.  ?  ?2. No lifting or straining for 6 weeks over 10 pounds. No pushing, pulling, straining for 6 weeks. ?  ?3. No driving for around 1 week(s).  Do not drive if you are taking narcotic pain medicine and make sure that your reaction time has returned.  ?  ?4. You can shower as soon as the next day after surgery. Shower daily.  Use your regular soap and water (not directly on the incision) and pat your incision(s) dry afterwards; don't rub.  No tub baths or submerging your body in water until cleared by your surgeon. If you have the soap that was given to you by pre-surgical testing that was used before surgery, you do not need to use it afterwards because this can irritate your incisions.  ?  ?5. No sexual activity and nothing in the vagina for 8 weeks. ?  ?6. You may experience a small amount of clear drainage from your incisions, which is normal.  If the drainage persists, increases, or changes color please call the office. ?  ?7. Do not use creams, lotions, or ointments such as neosporin on your incisions after surgery until advised by your surgeon because they can cause removal of the dermabond glue on your incisions.   ?  ?8. You may experience vaginal spotting after surgery or around the 6-8 week mark from surgery when the stitches at the top of the vagina begin to dissolve.  The spotting is normal but if you experience heavy bleeding, call our office. ?  ?9. Take Tylenol or ibuprofen first for pain and only use narcotic pain medication for severe pain not relieved by the Tylenol or Ibuprofen.  Monitor your Tylenol intake to a max of 4,000 mg in a 24 hour period. You can alternate these medications after  surgery. ?  ?Diet: ?1. Low sodium Heart Healthy Diet is recommended but you are cleared to resume your normal (before surgery) diet after your procedure. ?  ?2. It is safe to use a laxative, such as Miralax or Colace, if you have difficulty moving your bowels. You have been prescribed Sennakot-S to take at bedtime every evening after surgery to keep bowel movements regular and to prevent constipation.   ?  ?Wound Care: ?1. Keep clean and dry.  Shower daily. ?  ?Reasons to call the Doctor: ?Fever - Oral temperature greater than 100.4 degrees Fahrenheit ?Foul-smelling vaginal discharge ?Difficulty urinating ?Nausea and vomiting ?Increased pain at the site of the incision that is unrelieved with pain medicine. ?Difficulty breathing with or without chest pain ?New calf pain especially if only on one side ?Sudden, continuing increased vaginal bleeding with or without clots. ?  ?Contacts: ?For questions or concerns you should contact: ?  ?Dr. Jeral Pinch at [phone] ?  ?Joylene John, NP at [phone] ?  ?After Hours: call [phone] and have the GYN Oncologist paged/contacted (after 5 pm or on the weekends). ?  ?Messages sent via mychart are for non-urgent matters and are not responded to after hours so for urgent needs, please call the after hours number. ?

## 2021-06-14 NOTE — Brief Op Note (Signed)
06/14/2021 ? ?3:29 PM ? ?PATIENT:  Helen Gardner  76 y.o. female ? ?PRE-OPERATIVE DIAGNOSIS:  COMPLEX ATYPICAL ENDOMETRIAL HYPERPLASIA ? ?POST-OPERATIVE DIAGNOSIS:  COMPLEX ATYPICAL ENDOMETRIAL HYPERPLASIA ? ?PROCEDURE:  Procedure(s): ?XI ROBOTIC ASSISTED TOTAL HYSTERECTOMY WITH BILATERAL SALPINGO OOPHORECTOMY; CYSTOSCOPY (N/A) ?SENTINEL NODE BIOPSY (N/A) ? ?SURGEON:  Surgeon(s) and Role: ?   Lafonda Mosses, MD - Primary ? ?ASSISTANTS: Joylene John Np  ? ?EBL:  200 mL  ? ?BLOOD ADMINISTERED:none ? ?DRAINS: none  ? ?LOCAL MEDICATIONS USED:  MARCAINE    ? ?SPECIMEN:  uterus, cervix, bilateral adnexa, right external iliac SLN, adjacent enlarged lymph node (also green), left obturator SLN ? ?DISPOSITION OF SPECIMEN:  PATHOLOGY ? ?COUNTS:  YES ? ?TOURNIQUET:  * No tourniquets in log * ? ?DICTATION: .Note written in EPIC ? ?PLAN OF CARE: Discharge to home after PACU ? ?PATIENT DISPOSITION:  PACU - hemodynamically stable. ?  ?Delay start of Pharmacological VTE agent (>24hrs) due to surgical blood loss or risk of bleeding: not applicable ? ?

## 2021-06-14 NOTE — Anesthesia Procedure Notes (Signed)
Procedure Name: Intubation ?Date/Time: 06/14/2021 1:02 PM ?Performed by: Eben Burow, CRNA ?Pre-anesthesia Checklist: Patient identified, Emergency Drugs available, Suction available, Patient being monitored and Timeout performed ?Patient Re-evaluated:Patient Re-evaluated prior to induction ?Oxygen Delivery Method: Circle system utilized ?Preoxygenation: Pre-oxygenation with 100% oxygen ?Induction Type: IV induction ?Ventilation: Mask ventilation without difficulty ?Laryngoscope Size: Mac and 4 ?Grade View: Grade II ?Tube type: Oral ?Tube size: 7.0 mm ?Number of attempts: 1 ?Airway Equipment and Method: Stylet ?Placement Confirmation: ETT inserted through vocal cords under direct vision, positive ETCO2 and breath sounds checked- equal and bilateral ?Secured at: 20 cm ?Tube secured with: Tape ?Dental Injury: Teeth and Oropharynx as per pre-operative assessment  ? ? ? ? ?

## 2021-06-14 NOTE — Interval H&P Note (Signed)
History and Physical Interval Note: ? ?06/14/2021 ?12:11 PM ? ?Helen Gardner  has presented today for surgery, with the diagnosis of COMPLEX ATYPICAL ENDOMETRIAL HYPERPLASIA.  The various methods of treatment have been discussed with the patient and family. After consideration of risks, benefits and other options for treatment, the patient has consented to  Procedure(s): ?XI ROBOTIC [place]; POSSIBLE LAPAROTOMY (N/A) ?SENTINEL NODE BIOPSY (N/A) ?LYMPH NODE DISSECTION (N/A) as a surgical intervention.  The patient's history has been reviewed, patient examined, no change in status, stable for surgery.  I have reviewed the patient's chart and labs.  Questions were answered to the patient's satisfaction.   ? ? ?Lafonda Mosses ? ? ?

## 2021-06-14 NOTE — Anesthesia Postprocedure Evaluation (Signed)
Anesthesia Post Note ? ?Patient: Helen Gardner ? ?Procedure(s) Performed: XI ROBOTIC ASSISTED TOTAL HYSTERECTOMY WITH BILATERAL SALPINGO OOPHORECTOMY; CYSTOSCOPY ?SENTINEL NODE BIOPSY ? ?  ? ?Patient location during evaluation: PACU ?Anesthesia Type: General ?Level of consciousness: awake and alert ?Pain management: pain level controlled ?Vital Signs Assessment: post-procedure vital signs reviewed and stable ?Respiratory status: spontaneous breathing, nonlabored ventilation, respiratory function stable and patient connected to nasal cannula oxygen ?Cardiovascular status: blood pressure returned to baseline and stable ?Postop Assessment: no apparent nausea or vomiting ?Anesthetic complications: no ? ? ?No notable events documented. ? ?Last Vitals:  ?Vitals:  ? 06/14/21 1600 06/14/21 1615  ?BP: 133/68 133/69  ?Pulse: 88 83  ?Resp: 17 14  ?Temp:    ?SpO2: 100% 99%  ?  ?Last Pain:  ?Vitals:  ? 06/14/21 1600  ?TempSrc:   ?PainSc: Asleep  ? ? ?  ?  ?  ?  ?  ?  ? ?Suzette Battiest E ? ? ? ? ?

## 2021-06-14 NOTE — Op Note (Signed)
OPERATIVE NOTE ? ?Pre-operative Diagnosis: CAH ? ?Post-operative Diagnosis: same ? ?Operation: Robotic-assisted laparoscopic total hysterectomy with bilateral salpingoophorectomy, SLN biopsy, cystoscopy  ? ?Surgeon: Jeral Pinch MD ? ?Assistant Surgeon: Lahoma Crocker MD (an MD assistant was necessary for tissue manipulation, management of robotic instrumentation, retraction and positioning due to the complexity of the case and hospital policies).  ? ?Anesthesia: GET ? ?Urine Output: 300cc ? ?Operative Findings: On EUA, small mobile uterus. On intra-abdominal entry, normal upper abdominal survey. Normal omentum, small and large bowel. Some adhesions of the omentum to the left upper abdominal wall and of the cecum to the right abdominal side wall. 8 cm bulbous uterus. Normal appearing bilateral adnexa. Mapping successful to right external iliac and left obturator SLN. Second green lymph node mildly enlarged along right external iliac artery (removed). Given difficulty with visualization of cervicovaginal junction during creation of the bladder flap, cystoscopy was performed with dome noted to be intact, brisk bilateral efflux from ureteral orifices.  ? ?Estimated Blood Loss:  200cc     ? ?Total IV Fluids:  see I&O flowsheet ?        ?Specimens: uterus, cervix, bilateral tubes and ovaries, right external iliac SLN (and adjacent enlarged lymph node), left obturator SLN ?        ?Complications:  None apparent; patient tolerated the procedure well. ?        ?Disposition: PACU - hemodynamically stable. ? ?Procedure Details  ?The patient was seen in the Holding Room. The risks, benefits, complications, treatment options, and expected outcomes were discussed with the patient.  The patient concurred with the proposed plan, giving informed consent.  The site of surgery properly noted/marked. The patient was identified as Aseret Hoffman and the procedure verified as a Robotic-assisted hysterectomy with bilateral salpingo  oophorectomy with SLN biopsy.  ? ?After induction of anesthesia, the patient was draped and prepped in the usual sterile manner. Patient was placed in supine position after anesthesia and draped and prepped in the usual sterile manner as follows: Her arms were tucked to her side with all appropriate precautions.  The shoulders were stabilized with padded shoulder blocks applied to the acromium processes.  The patient was placed in the semi-lithotomy position in [place].  The perineum and vagina were prepped with CholoraPrep. The patient was draped after the CholoraPrep had been allowed to dry for 3 minutes.  A Time Out was held and the above information confirmed. ? ?The urethra was prepped with Betadine. Foley catheter was placed.  A sterile speculum was placed in the vagina.  The cervix was grasped with a single-tooth tenaculum. '2mg'$  total of ICG was injected into the cervical stroma at 2 and 9 o'clock with 1cc injected at a 1cm and 48m depth (concentration 0.'5mg'$ /ml) in all locations. The cervix was dilated with PKennon Roundsdilators.  The ZUMI uterine manipulator with a medium colpotomizer ring was placed without difficulty.  A pneum occluder balloon was placed over the manipulator.  OG tube placement was confirmed and to suction.  ? ?Next, a 10 mm skin incision was made 1 cm below the subcostal margin in the midclavicular line.  The 5 mm Optiview port and scope was used for direct entry.  Opening pressure was under 10 mm CO2.  The abdomen was insufflated and the findings were noted as above.   At this point and all points during the procedure, the patient's intra-abdominal pressure did not exceed 15 mmHg. Next, an 8 mm skin incision was made superior to  the umbilicus and a right and left port were placed about 8 cm lateral to the robot port on the right and left side. The 5 mm assist trocar was exchanged for a 10-12 mm port. All ports were placed under direct visualization.  The patient was placed in steep  Trendelenburg.  Bowel was folded away into the upper abdomen.  The robot was docked in the normal manner. ? ?The right and left peritoneum were opened parallel to the IP ligament to open the retroperitoneal spaces bilaterally. The round ligaments were transected. The SLN mapping was performed in bilateral pelvic basins. After identifying the ureters, the para rectal and paravesical spaces were opened up entirely with careful dissection below the level of the ureters bilaterally and to the depth of the uterine artery origin in order to skeletonize the uterine "web" and ensure visualization of all parametrial channels. The para-aortic basins were carefully exposed and evaluated for isolated para-aortic SLN's. Lymphatic channels were identified travelling to the following visualized sentinel lymph node's: right external iliac and obturator SLN. These SLN's were separated from their surrounding lymphatic tissue, removed and sent for permanent pathology. ? ?The hysterectomy was started.  The ureter was again noted to be on the medial leaf of the broad ligament.  The peritoneum above the ureter was incised and stretched and the infundibulopelvic ligament was skeletonized, cauterized and cut.  The posterior peritoneum was taken down to the level of the KOH ring.  The anterior peritoneum was also taken down.  The bladder flap was created to the level of the KOH ring.  This was quite challenging due to difficulty visualizing KOH ring anteriorly. The uterine artery on the right side was skeletonized, cauterized and cut in the normal manner.  A similar procedure was performed on the left.  The colpotomy was made and the uterus, cervix, bilateral ovaries and tubes were amputated and delivered through the vagina.  Pedicles were inspected and excellent hemostasis was achieved.   ? ?The colpotomy at the vaginal cuff was closed with Vicryl on a CT1 needle in a running manner.  Irrigation was used and excellent hemostasis was  achieved.  At this point in the procedure was completed.  Robotic instruments were removed under direct visulaization.  The robot was undocked. The fascia at the 10-12 mm port was closed with 0 Vicryl on a UR-5 needle.  The subcuticular tissue was closed with 4-0 Vicryl and the skin was closed with 4-0 Monocryl in a subcuticular manner.  Dermabond was applied.   ? ?The foley catheter was removed and cystoscopy was performed with findings noted above.  ? ?The vagina was swabbed with minimal bleeding noted.   All sponge, lap and needle counts were correct x  3.  ? ?The patient was transferred to the recovery room in stable condition. ? ?Jeral Pinch, MD ? ?

## 2021-06-14 NOTE — Transfer of Care (Signed)
Immediate Anesthesia Transfer of Care Note ? ?Patient: Natalea Sutliff ? ?Procedure(s) Performed: XI ROBOTIC ASSISTED TOTAL HYSTERECTOMY WITH BILATERAL SALPINGO OOPHORECTOMY; CYSTOSCOPY ?SENTINEL NODE BIOPSY ? ?Patient Location: PACU ? ?Anesthesia Type:General ? ?Level of Consciousness: awake, alert  and patient cooperative ? ?Airway & Oxygen Therapy: Patient Spontanous Breathing and Patient connected to face mask oxygen ? ?Post-op Assessment: Report given to RN and Post -op Vital signs reviewed and stable ? ?Post vital signs: Reviewed and stable ? ?Last Vitals:  ?Vitals Value Taken Time  ?BP 146/97 06/14/21 1541  ?Temp    ?Pulse 86 06/14/21 1544  ?Resp 14 06/14/21 1544  ?SpO2 100 % 06/14/21 1544  ?Vitals shown include unvalidated device data. ? ?Last Pain:  ?Vitals:  ? 06/14/21 1110  ?TempSrc: Oral  ?PainSc: 0-No pain  ?   ? ?Patients Stated Pain Goal: 3 (06/14/21 1110) ? ?Complications: No notable events documented. ?

## 2021-06-14 NOTE — ED Provider Notes (Signed)
?[facility] DEPT ?Provider Note ? ? ?CSN: [id number] ?Arrival date & time: 06/14/21  2303 ? ?  ? ?History ? ?Chief Complaint  ?Patient presents with  ? Hypotension  ? ? ?Helen Gardner is a 76 y.o. female. ? ?The history is provided by the patient and medical records.  ?Helen Gardner is a 76 y.o. female who presents to the Emergency Department complaining of post op issue. She presents to the ED by EMS for evaluation of weakness.  She is s/p robotic assisted total hysterectomy earlier today, d/c home at 5p from pacu.  She had been able to ambulate slowly around the home, but about one hour prior to ED arrival she developed profound weakness and felt near syncopal when she attempted to ambulate.  It took her a while to reach her husband for assistance. On EMS arrival her BP was 80/58.  She has only taken ibuprofen since hospital discharge.  Has mild throat discomfort, mild sob, mild post op pain, light vaginal bleeding.  She did experience some hemorrhoidal bleeding prior to surgery.  She did have facial swelling and right hand swelling at time of waking from surgery.   ? ?Hx of hypothyroid, endometrial hyperplasia.  No blood thinners.   ?  ? ?Home Medications ?Prior to Admission medications   ?Medication Sig Start Date End Date Taking? Authorizing Provider  ?ARMOUR THYROID 90 MG tablet Take 90 mg by mouth in the morning. 06/08/19  Yes [provider]  ?bisacodyl (DULCOLAX) 5 MG EC tablet Take 15 mg by mouth once.   Yes [provider]  ?cromolyn (NASALCROM) 5.2 MG/ACT nasal spray Place 1 spray into both nostrils daily.   Yes [provider]  ?fluocinonide cream (LIDEX) 2.20 % Apply 1 application. topically 4 (four) times a week.   Yes [provider]  ?MAGNESIUM PO Take 1 tablet by mouth 2 (two) times a week.   Yes [provider]  ?Polyethyl Glycol-Propyl Glycol (SYSTANE OP) Place 1 drop into both eyes daily as needed (for dry eyes).    Yes [provider]  ?Psyllium (METAMUCIL PO) Take 1 Scoop by mouth every other day.   Yes [provider]  ?traZODone (DESYREL) 100 MG tablet Take 100 mg by mouth at bedtime.     Yes [provider]  ?VITAMIN D PO Take 1 tablet by mouth 2 (two) times a week.   Yes [provider]  ?zolpidem (AMBIEN) 5 MG tablet Take 5 mg by mouth at bedtime as needed for sleep.   Yes [provider]  ?senna-docusate (SENOKOT-S) 8.6-50 MG tablet Take 2 tablets by mouth at bedtime. For AFTER surgery, do not take if having diarrhea 06/09/21   Joylene John D, NP  ?traMADol (ULTRAM) 50 MG tablet Take 1 tablet (50 mg total) by mouth every 6 (six) hours as needed for severe pain. For AFTER surgery only, do not take and drive 2/54/27   Joylene John D, NP  ?   ? ?Allergies    ?Sulfonamide derivatives, Sulfamethoxazole, Hydrocodone, and Hydrocodone-acetaminophen   ? ?Review of Systems   ?Review of Systems  ?All other systems reviewed and are negative. ? ?Physical Exam ?Updated Vital Signs ?BP 90/63   Pulse 72   Temp 98.6 ?F (37 ?C) (Oral)   Resp 16   Ht '5\' 7"'$  (1.702 m)   Wt 70.8 kg   SpO2 98%   BMI 24.43 kg/m?  ?Physical Exam ?Vitals and nursing note reviewed.  ?Constitutional:   ?  Appearance: She is well-developed.  ?HENT:  ?   Head: Normocephalic and atraumatic.  ?   Comments: Soft tissue swelling/crepitance of the periorbital region bilaterally ?Cardiovascular:  ?   Rate and Rhythm: Regular rhythm. Tachycardia present.  ?   Heart sounds: No murmur heard. ?Pulmonary:  ?   Effort: Pulmonary effort is normal. No respiratory distress.  ?   Breath sounds: Normal breath sounds.  ?   Comments: Right-sided chest wall crepitance ?Abdominal:  ?   Palpations: Abdomen is soft.  ?   Tenderness: There is no guarding or rebound.  ?   Comments: Postsurgical wounds with mild local ecchymosis, no active bleeding.  Wounds are clean and dry.  There is appropriate mild abdominal tenderness   ?Musculoskeletal:     ?   General: No tenderness.  ?   Comments: There is soft tissue swelling and ecchymosis to the right hand, 2+ right radial pulse.  No lower extremity edema  ?Skin: ?   General: Skin is warm and dry.  ?Neurological:  ?   Mental Status: She is alert and oriented to person, place, and time.  ?Psychiatric:     ?   Behavior: Behavior normal.  ? ? ?ED Results / Procedures / Treatments   ?Labs ?(all labs ordered are listed, but only abnormal results are displayed) ?Labs Reviewed  ?COMPREHENSIVE METABOLIC PANEL - Abnormal; Notable for the following components:  ?    Result Value  ? Glucose, Bld 213 (*)   ? Calcium 7.9 (*)   ? Total Protein 6.0 (*)   ? Albumin 3.2 (*)   ? All other components within normal limits  ?CBC WITH DIFFERENTIAL/PLATELET - Abnormal; Notable for the following components:  ? WBC 13.6 (*)   ? RBC 3.42 (*)   ? Hemoglobin 10.4 (*)   ? HCT 31.6 (*)   ? Neutro Abs 12.0 (*)   ? All other components within normal limits  ?BRAIN NATRIURETIC PEPTIDE - Abnormal; Notable for the following components:  ? B Natriuretic Peptide 101.3 (*)   ? All other components within normal limits  ?HEMOGLOBIN AND HEMATOCRIT, BLOOD - Abnormal; Notable for the following components:  ? Hemoglobin 10.1 (*)   ? HCT 30.6 (*)   ? All other components within normal limits  ?I-STAT CHEM 8, ED - Abnormal; Notable for the following components:  ? BUN 7 (*)   ? Glucose, Bld 203 (*)   ? Calcium, Ion 1.13 (*)   ? Hemoglobin 10.5 (*)   ? HCT 31.0 (*)   ? All other components within normal limits  ?RESP PANEL BY RT-PCR (FLU A&B, COVID) [id number]  ?PROTIME-INR  ?URINALYSIS, ROUTINE W REFLEX MICROSCOPIC  ?CBC  ?BASIC METABOLIC PANEL  ?MAGNESIUM  ?PHOSPHORUS  ?TYPE AND SCREEN  ?TROPONIN I (HIGH SENSITIVITY)  ? ? ?EKG ?EKG Interpretation ? ?Date/Time:  Thursday June 15 2021 00:06:08 EDT ?Ventricular Rate:  100 ?PR Interval:  140 ?QRS Duration: 78 ?QT Interval:  403 ?QTC Calculation: 520 ?R Axis:   82 ?Text Interpretation: Sinus  tachycardia Borderline right axis deviation Low voltage, precordial leads Borderline T abnormalities, anterior leads Prolonged QT interval Confirmed by Quintella Reichert [phone]) on 06/15/2021 12:34:48 AM ? ?Radiology ?DG Chest Port 1 View ? ?Result Date: 06/14/2021 ?CLINICAL DATA:  Hypotension. Status post total hysterectomy. Subcutaneous emphysema. EXAM: PORTABLE CHEST 1 VIEW COMPARISON:  Chest CT dated 10/26/2019. FINDINGS: No focal consolidation, pleural effusion, or pneumothorax. The cardiac silhouette is within normal limits. Streaky air over the mediastinum may  represent subcutaneous air. Pneumomediastinum is not excluded. There is moderate amount of subcutaneous air in the chest wall and neck. No acute osseous pathology. IMPRESSION: 1. No acute cardiopulmonary process. 2. Subcutaneous emphysema. Electronically Signed   By: Anner Crete M.D.   On: 06/14/2021 23:38   ? ?Procedures ?Procedures  ? ? ?Medications Ordered in ED ?Medications  ?lactated ringers infusion ( Intravenous New Bag/Given 06/15/21 0205)  ?acetaminophen (TYLENOL) tablet 650 mg (has no administration in time range)  ?senna (SENOKOT) tablet 8.6 mg (has no administration in time range)  ?simethicone (MYLICON) chewable tablet 80 mg (has no administration in time range)  ?ondansetron (ZOFRAN) tablet 4 mg (has no administration in time range)  ?  Or  ?ondansetron (ZOFRAN) injection 4 mg (has no administration in time range)  ?dextrose 5 % and 0.45 % NaCl with KCl 10 mEq/L infusion ( Intravenous New Bag/Given 06/15/21 0425)  ?traMADol (ULTRAM) tablet 50 mg (50 mg Oral Given 06/15/21 0201)  ?ondansetron [facility]) injection 4 mg (4 mg Intravenous Given 06/15/21 0155)  ?metoCLOPramide (REGLAN) injection 10 mg (10 mg Intravenous Given 06/15/21 0156)  ? ? ?ED Course/ Medical Decision Making/ A&P ?  ?                        ?Medical Decision Making ?Amount and/or Complexity of Data Reviewed ?Labs: ordered. ?Radiology: ordered. ? ?Risk ?Prescription drug  management. ?Decision regarding hospitalization. ? ? ?Patient here for evaluation of weakness, near syncopal episode following total hysterectomy earlier today.  Patient with mild tachycardia, appropriate abdominal tenderness

## 2021-06-14 NOTE — ED Triage Notes (Addendum)
Patient BIB EMS from home, pt had hysterectomy at 1 pm. Pt denies complications with procedure or bleeding, states she got up to go to bathroom and became dizzy. Upon EMS arrival BP 80/58.  ? ?22 g LAC, 400 ml NS given en route ?BP improved to 100/57 ?

## 2021-06-15 ENCOUNTER — Encounter (HOSPITAL_COMMUNITY)
Admission: EM | Disposition: A | Discharge status: Home / Self Care | Payer: Self-pay | Source: Home / Self Care | Attending: Gynecologic Oncology

## 2021-06-15 ENCOUNTER — Observation Stay (HOSPITAL_BASED_OUTPATIENT_CLINIC_OR_DEPARTMENT_OTHER): Payer: Medicare HMO | Admitting: Anesthesiology

## 2021-06-15 ENCOUNTER — Encounter (HOSPITAL_COMMUNITY): Payer: Self-pay | Admitting: Gynecologic Oncology

## 2021-06-15 ENCOUNTER — Observation Stay (HOSPITAL_COMMUNITY): Payer: Medicare HMO | Admitting: Anesthesiology

## 2021-06-15 DIAGNOSIS — R55 Syncope and collapse: Secondary | ICD-10-CM | POA: Diagnosis present

## 2021-06-15 DIAGNOSIS — D62 Acute posthemorrhagic anemia: Secondary | ICD-10-CM

## 2021-06-15 DIAGNOSIS — M797 Fibromyalgia: Secondary | ICD-10-CM

## 2021-06-15 HISTORY — PX: LAPAROSCOPY: SHX197

## 2021-06-15 LAB — CBC WITH DIFFERENTIAL/PLATELET
Abs Immature Granulocytes: 0.05 10*3/uL (ref 0.00–0.07)
Basophils Absolute: 0 10*3/uL (ref 0.0–0.1)
Basophils Relative: 0 %
Eosinophils Absolute: 0 10*3/uL (ref 0.0–0.5)
Eosinophils Relative: 0 %
HCT: 31.6 % — ABNORMAL LOW (ref 36.0–46.0)
Hemoglobin: 10.4 g/dL — ABNORMAL LOW (ref 12.0–15.0)
Immature Granulocytes: 0 %
Lymphocytes Relative: 6 %
Lymphs Abs: 0.8 10*3/uL (ref 0.7–4.0)
MCH: 30.4 pg (ref 26.0–34.0)
MCHC: 32.9 g/dL (ref 30.0–36.0)
MCV: 92.4 fL (ref 80.0–100.0)
Monocytes Absolute: 0.7 10*3/uL (ref 0.1–1.0)
Monocytes Relative: 5 %
Neutro Abs: 12 10*3/uL — ABNORMAL HIGH (ref 1.7–7.7)
Neutrophils Relative %: 89 %
Platelets: 242 10*3/uL (ref 150–400)
RBC: 3.42 MIL/uL — ABNORMAL LOW (ref 3.87–5.11)
RDW: 13.6 % (ref 11.5–15.5)
WBC: 13.6 10*3/uL — ABNORMAL HIGH (ref 4.0–10.5)
nRBC: 0 % (ref 0.0–0.2)

## 2021-06-15 LAB — COMPREHENSIVE METABOLIC PANEL
ALT: 15 U/L (ref 0–44)
AST: 19 U/L (ref 15–41)
Albumin: 3.2 g/dL — ABNORMAL LOW (ref 3.5–5.0)
Alkaline Phosphatase: 41 U/L (ref 38–126)
Anion gap: 8 (ref 5–15)
BUN: 9 mg/dL (ref 8–23)
CO2: 23 mmol/L (ref 22–32)
Calcium: 7.9 mg/dL — ABNORMAL LOW (ref 8.9–10.3)
Chloride: 104 mmol/L (ref 98–111)
Creatinine, Ser: 0.86 mg/dL (ref 0.44–1.00)
GFR, Estimated: >60 mL/min (ref >60)
Glucose, Bld: 213 mg/dL — ABNORMAL HIGH (ref 70–99)
Potassium: 4 mmol/L (ref 3.5–5.1)
Sodium: 135 mmol/L (ref 135–145)
Total Bilirubin: 0.5 mg/dL (ref 0.3–1.2)
Total Protein: 6 g/dL — ABNORMAL LOW (ref 6.5–8.1)

## 2021-06-15 LAB — BASIC METABOLIC PANEL
Anion gap: 5 (ref 5–15)
Anion gap: 7 (ref 5–15)
BUN: 10 mg/dL (ref 8–23)
BUN: 7 mg/dL — ABNORMAL LOW (ref 8–23)
CO2: 25 mmol/L (ref 22–32)
CO2: 26 mmol/L (ref 22–32)
Calcium: 7.7 mg/dL — ABNORMAL LOW (ref 8.9–10.3)
Calcium: 8 mg/dL — ABNORMAL LOW (ref 8.9–10.3)
Chloride: 105 mmol/L (ref 98–111)
Chloride: 107 mmol/L (ref 98–111)
Creatinine, Ser: 0.79 mg/dL (ref 0.44–1.00)
Creatinine, Ser: 0.59 mg/dL (ref 0.44–1.00)
GFR, Estimated: >60 mL/min (ref >60)
GFR, Estimated: >60 mL/min (ref >60)
Glucose, Bld: 141 mg/dL — ABNORMAL HIGH (ref 70–99)
Glucose, Bld: 150 mg/dL — ABNORMAL HIGH (ref 70–99)
Potassium: 4.1 mmol/L (ref 3.5–5.1)
Potassium: 4.5 mmol/L (ref 3.5–5.1)
Sodium: 135 mmol/L (ref 135–145)
Sodium: 140 mmol/L (ref 135–145)

## 2021-06-15 LAB — HEMOGLOBIN AND HEMATOCRIT, BLOOD
HCT: 30.6 % — ABNORMAL LOW (ref 36.0–46.0)
HCT: 27.6 % — ABNORMAL LOW (ref 36.0–46.0)
HCT: 28.2 % — ABNORMAL LOW (ref 36.0–46.0)
Hemoglobin: 10.1 g/dL — ABNORMAL LOW (ref 12.0–15.0)
Hemoglobin: 9.3 g/dL — ABNORMAL LOW (ref 12.0–15.0)
Hemoglobin: 9.2 g/dL — ABNORMAL LOW (ref 12.0–15.0)

## 2021-06-15 LAB — RESP PANEL BY RT-PCR (FLU A&B, COVID) ARPGX2
Influenza A by PCR: NEGATIVE
Influenza B by PCR: NEGATIVE
SARS Coronavirus 2 by RT PCR: NEGATIVE

## 2021-06-15 LAB — I-STAT CHEM 8, ED
BUN: 7 mg/dL — ABNORMAL LOW (ref 8–23)
Calcium, Ion: 1.13 mmol/L — ABNORMAL LOW (ref 1.15–1.40)
Chloride: 105 mmol/L (ref 98–111)
Creatinine, Ser: 0.7 mg/dL (ref 0.44–1.00)
Glucose, Bld: 203 mg/dL — ABNORMAL HIGH (ref 70–99)
HCT: 31 % — ABNORMAL LOW (ref 36.0–46.0)
Hemoglobin: 10.5 g/dL — ABNORMAL LOW (ref 12.0–15.0)
Potassium: 4.1 mmol/L (ref 3.5–5.1)
Sodium: 139 mmol/L (ref 135–145)
TCO2: 23 mmol/L (ref 22–32)

## 2021-06-15 LAB — TYPE AND SCREEN
ABO/RH(D): O NEG
Antibody Screen: NEGATIVE

## 2021-06-15 LAB — CBC
HCT: 27.2 % — ABNORMAL LOW (ref 36.0–46.0)
HCT: 24.5 % — ABNORMAL LOW (ref 36.0–46.0)
Hemoglobin: 8.9 g/dL — ABNORMAL LOW (ref 12.0–15.0)
Hemoglobin: 7.9 g/dL — ABNORMAL LOW (ref 12.0–15.0)
MCH: 30.5 pg (ref 26.0–34.0)
MCH: 30.4 pg (ref 26.0–34.0)
MCHC: 32.7 g/dL (ref 30.0–36.0)
MCHC: 32.2 g/dL (ref 30.0–36.0)
MCV: 93.2 fL (ref 80.0–100.0)
MCV: 94.2 fL (ref 80.0–100.0)
Platelets: 217 10*3/uL (ref 150–400)
Platelets: 223 10*3/uL (ref 150–400)
RBC: 2.92 MIL/uL — ABNORMAL LOW (ref 3.87–5.11)
RBC: 2.6 MIL/uL — ABNORMAL LOW (ref 3.87–5.11)
RDW: 13.6 % (ref 11.5–15.5)
RDW: 13.8 % (ref 11.5–15.5)
WBC: 11.7 10*3/uL — ABNORMAL HIGH (ref 4.0–10.5)
WBC: 12.7 10*3/uL — ABNORMAL HIGH (ref 4.0–10.5)
nRBC: 0 % (ref 0.0–0.2)
nRBC: 0 % (ref 0.0–0.2)

## 2021-06-15 LAB — PHOSPHORUS: Phosphorus: 3.8 mg/dL (ref 2.5–4.6)

## 2021-06-15 LAB — BRAIN NATRIURETIC PEPTIDE: B Natriuretic Peptide: 101.3 pg/mL — ABNORMAL HIGH (ref 0.0–100.0)

## 2021-06-15 LAB — PREPARE RBC (CROSSMATCH)

## 2021-06-15 LAB — TROPONIN I (HIGH SENSITIVITY): Troponin I (High Sensitivity): 3 ng/L (ref <18)

## 2021-06-15 LAB — PROTIME-INR
INR: 1.2 (ref 0.8–1.2)
Prothrombin Time: 14.8 seconds (ref 11.4–15.2)

## 2021-06-15 LAB — MAGNESIUM: Magnesium: 1.7 mg/dL (ref 1.7–2.4)

## 2021-06-15 SURGERY — LAPAROSCOPY, DIAGNOSTIC
Anesthesia: General | Site: Abdomen

## 2021-06-15 MED ORDER — ARTIFICIAL TEARS OPHTHALMIC OINT
1.0000 "application " | TOPICAL_OINTMENT | Freq: Every day | OPHTHALMIC | Status: DC | PRN
  Filled 2021-06-15: qty 3.5

## 2021-06-15 MED ORDER — LIDOCAINE 2% (20 MG/ML) 5 ML SYRINGE
INTRAMUSCULAR | Status: DC | PRN
  Administered 2021-06-15: 60 mg via INTRAVENOUS

## 2021-06-15 MED ORDER — ZOLPIDEM TARTRATE 5 MG PO TABS: 5.0000 mg | ORAL_TABLET | Freq: Every evening | ORAL | Status: DC | PRN

## 2021-06-15 MED ORDER — ONDANSETRON HCL 4 MG/2ML IJ SOLN
4.0000 mg | Freq: Four times a day (QID) | INTRAMUSCULAR | Status: DC | PRN
  Administered 2021-06-15 – 2021-06-17 (×4): 4 mg via INTRAVENOUS
  Filled 2021-06-15 (×3): qty 2

## 2021-06-15 MED ORDER — LIDOCAINE HCL (PF) 2 % IJ SOLN
INTRAMUSCULAR | Status: AC
  Filled 2021-06-15: qty 5

## 2021-06-15 MED ORDER — SUGAMMADEX SODIUM 200 MG/2ML IV SOLN
INTRAVENOUS | Status: DC | PRN
Start: 2021-06-15 — End: 2021-06-15
  Administered 2021-06-15: 150 mg via INTRAVENOUS

## 2021-06-15 MED ORDER — KCL IN DEXTROSE-NACL 10-5-0.45 MEQ/L-%-% IV SOLN
INTRAVENOUS | Status: DC
  Filled 2021-06-15 (×4): qty 1000

## 2021-06-15 MED ORDER — CEFAZOLIN SODIUM-DEXTROSE 2-4 GM/100ML-% IV SOLN
2.0000 g | Freq: Once | INTRAVENOUS | Status: AC
  Administered 2021-06-15: 2 g via INTRAVENOUS

## 2021-06-15 MED ORDER — SENNA 8.6 MG PO TABS
1.0000 | ORAL_TABLET | Freq: Every day | ORAL | Status: DC
  Administered 2021-06-16 – 2021-06-17 (×2): 8.6 mg via ORAL
  Filled 2021-06-15 (×3): qty 1

## 2021-06-15 MED ORDER — 0.9 % SODIUM CHLORIDE (POUR BTL) OPTIME
TOPICAL | Status: DC | PRN
  Administered 2021-06-15: 1000 mL

## 2021-06-15 MED ORDER — OXYCODONE HCL 5 MG PO TABS: 5.0000 mg | ORAL_TABLET | Freq: Once | ORAL | Status: DC | PRN

## 2021-06-15 MED ORDER — METOCLOPRAMIDE HCL 5 MG/ML IJ SOLN
10.0000 mg | Freq: Once | INTRAMUSCULAR | Status: AC
  Administered 2021-06-15: 10 mg via INTRAVENOUS
  Filled 2021-06-15: qty 2

## 2021-06-15 MED ORDER — BUPIVACAINE HCL 0.25 % IJ SOLN
INTRAMUSCULAR | Status: DC | PRN
Start: 2021-06-15 — End: 2021-06-15
  Administered 2021-06-15: 20 mL

## 2021-06-15 MED ORDER — PHENYLEPHRINE 40 MCG/ML (10ML) SYRINGE FOR IV PUSH (FOR BLOOD PRESSURE SUPPORT)
PREFILLED_SYRINGE | INTRAVENOUS | Status: DC | PRN
  Administered 2021-06-15: 80 ug via INTRAVENOUS

## 2021-06-15 MED ORDER — SIMETHICONE 80 MG PO CHEW
80.0000 mg | CHEWABLE_TABLET | Freq: Four times a day (QID) | ORAL | Status: DC | PRN
  Administered 2021-06-17: 80 mg via ORAL
  Filled 2021-06-15 (×2): qty 1

## 2021-06-15 MED ORDER — ACETAMINOPHEN 325 MG PO TABS
650.0000 mg | ORAL_TABLET | ORAL | Status: DC | PRN
  Administered 2021-06-15 – 2021-06-17 (×8): 650 mg via ORAL
  Administered 2021-06-17: 325 mg via ORAL
  Administered 2021-06-17 – 2021-06-18 (×3): 650 mg via ORAL
  Filled 2021-06-15 (×12): qty 2

## 2021-06-15 MED ORDER — LACTATED RINGERS IV SOLN: INTRAVENOUS | Status: DC

## 2021-06-15 MED ORDER — CEFAZOLIN SODIUM-DEXTROSE 2-4 GM/100ML-% IV SOLN
INTRAVENOUS | Status: AC
  Filled 2021-06-15: qty 100

## 2021-06-15 MED ORDER — ONDANSETRON HCL 4 MG/2ML IJ SOLN
INTRAMUSCULAR | Status: AC
  Filled 2021-06-15: qty 2

## 2021-06-15 MED ORDER — ONDANSETRON HCL 4 MG PO TABS
4.0000 mg | ORAL_TABLET | Freq: Four times a day (QID) | ORAL | Status: DC | PRN
  Administered 2021-06-17: 4 mg via ORAL
  Filled 2021-06-15: qty 1

## 2021-06-15 MED ORDER — PHENYLEPHRINE 40 MCG/ML (10ML) SYRINGE FOR IV PUSH (FOR BLOOD PRESSURE SUPPORT)
PREFILLED_SYRINGE | INTRAVENOUS | Status: AC
  Filled 2021-06-15: qty 10

## 2021-06-15 MED ORDER — AMISULPRIDE (ANTIEMETIC) 5 MG/2ML IV SOLN: 10.0000 mg | Freq: Once | INTRAVENOUS | Status: DC | PRN

## 2021-06-15 MED ORDER — ONDANSETRON HCL 4 MG/2ML IJ SOLN: 4.0000 mg | Freq: Once | INTRAMUSCULAR | Status: DC | PRN

## 2021-06-15 MED ORDER — ONDANSETRON HCL 4 MG/2ML IJ SOLN
4.0000 mg | Freq: Once | INTRAMUSCULAR | Status: AC
  Administered 2021-06-15: 4 mg via INTRAVENOUS
  Filled 2021-06-15: qty 2

## 2021-06-15 MED ORDER — FENTANYL CITRATE (PF) 250 MCG/5ML IJ SOLN
INTRAMUSCULAR | Status: AC
  Filled 2021-06-15: qty 5

## 2021-06-15 MED ORDER — PROPOFOL 10 MG/ML IV BOLUS
INTRAVENOUS | Status: AC
  Filled 2021-06-15: qty 20

## 2021-06-15 MED ORDER — PHENYLEPHRINE HCL-NACL 20-0.9 MG/250ML-% IV SOLN
INTRAVENOUS | Status: DC | PRN
  Administered 2021-06-15: 50 ug/min via INTRAVENOUS

## 2021-06-15 MED ORDER — ALBUMIN HUMAN 5 % IV SOLN
INTRAVENOUS | Status: DC | PRN
Start: 2021-06-15 — End: 2021-06-15

## 2021-06-15 MED ORDER — ROCURONIUM BROMIDE 10 MG/ML (PF) SYRINGE
PREFILLED_SYRINGE | INTRAVENOUS | Status: DC | PRN
  Administered 2021-06-15: 40 mg via INTRAVENOUS
  Administered 2021-06-15: 60 mg via INTRAVENOUS

## 2021-06-15 MED ORDER — OXYCODONE HCL 5 MG/5ML PO SOLN: 5.0000 mg | Freq: Once | ORAL | Status: DC | PRN

## 2021-06-15 MED ORDER — ONDANSETRON HCL 4 MG/2ML IJ SOLN
INTRAMUSCULAR | Status: DC | PRN
  Administered 2021-06-15: 4 mg via INTRAVENOUS

## 2021-06-15 MED ORDER — FENTANYL CITRATE PF 50 MCG/ML IJ SOSY: 25.0000 ug | PREFILLED_SYRINGE | INTRAMUSCULAR | Status: DC | PRN

## 2021-06-15 MED ORDER — SODIUM CHLORIDE (PF) 0.9 % IJ SOLN
INTRAMUSCULAR | Status: AC
  Filled 2021-06-15: qty 20

## 2021-06-15 MED ORDER — THYROID 60 MG PO TABS
90.0000 mg | ORAL_TABLET | Freq: Every morning | ORAL | Status: DC
  Administered 2021-06-15 – 2021-06-18 (×4): 90 mg via ORAL
  Filled 2021-06-15 (×4): qty 1

## 2021-06-15 MED ORDER — CROMOLYN SODIUM 5.2 MG/ACT NA AERS: 1.0000 | INHALATION_SPRAY | Freq: Every day | NASAL | Status: DC

## 2021-06-15 MED ORDER — TRAMADOL HCL 50 MG PO TABS
50.0000 mg | ORAL_TABLET | Freq: Four times a day (QID) | ORAL | Status: DC | PRN
  Administered 2021-06-15 – 2021-06-17 (×3): 50 mg via ORAL
  Filled 2021-06-15 (×3): qty 1

## 2021-06-15 MED ORDER — BUPIVACAINE LIPOSOME 1.3 % IJ SUSP
INTRAMUSCULAR | Status: AC
  Filled 2021-06-15: qty 10

## 2021-06-15 MED ORDER — LACTATED RINGERS IR SOLN
Status: DC | PRN
  Administered 2021-06-15 (×3): 1000 mL

## 2021-06-15 MED ORDER — FENTANYL CITRATE (PF) 250 MCG/5ML IJ SOLN
INTRAMUSCULAR | Status: DC | PRN
  Administered 2021-06-15 (×3): 50 ug via INTRAVENOUS

## 2021-06-15 MED ORDER — SURGIFLO WITH THROMBIN (HEMOSTATIC MATRIX KIT) OPTIME
TOPICAL | Status: DC | PRN
  Administered 2021-06-15: 1 via TOPICAL

## 2021-06-15 MED ORDER — BUPIVACAINE HCL 0.25 % IJ SOLN
INTRAMUSCULAR | Status: AC
  Filled 2021-06-15: qty 1

## 2021-06-15 MED ORDER — PROPOFOL 10 MG/ML IV BOLUS
INTRAVENOUS | Status: DC | PRN
  Administered 2021-06-15: 120 mg via INTRAVENOUS

## 2021-06-15 MED ORDER — ALBUMIN HUMAN 5 % IV SOLN
INTRAVENOUS | Status: AC
  Filled 2021-06-15: qty 500

## 2021-06-15 MED ORDER — DEXAMETHASONE SODIUM PHOSPHATE 10 MG/ML IJ SOLN
INTRAMUSCULAR | Status: AC
  Filled 2021-06-15: qty 1

## 2021-06-15 MED ORDER — TRAZODONE HCL 100 MG PO TABS
100.0000 mg | ORAL_TABLET | Freq: Every day | ORAL | Status: DC
  Administered 2021-06-17: 100 mg via ORAL
  Filled 2021-06-15 (×2): qty 1

## 2021-06-15 MED ORDER — DEXAMETHASONE SODIUM PHOSPHATE 10 MG/ML IJ SOLN
INTRAMUSCULAR | Status: DC | PRN
  Administered 2021-06-15: 4 mg via INTRAVENOUS

## 2021-06-15 MED ORDER — ROCURONIUM BROMIDE 10 MG/ML (PF) SYRINGE
PREFILLED_SYRINGE | INTRAVENOUS | Status: AC
  Filled 2021-06-15: qty 10

## 2021-06-15 SURGICAL SUPPLY — 102 items
ADH SKN CLS APL DERMABOND .7 (GAUZE/BANDAGES/DRESSINGS) ×2
AGENT HMST KT MTR STRL THRMB (HEMOSTASIS) ×2
APL ESCP 34 STRL LF DISP (HEMOSTASIS) ×2
APL PRP STRL LF DISP 70% ISPRP (MISCELLANEOUS) ×2
APL SWBSTK 6 STRL LF DISP (MISCELLANEOUS) ×4
APPLICATOR COTTON TIP 6 STRL (MISCELLANEOUS) ×2 IMPLANT
APPLICATOR COTTON TIP 6IN STRL (MISCELLANEOUS) ×6 IMPLANT
APPLICATOR SURGIFLO ENDO (HEMOSTASIS) ×2 IMPLANT
ATTRACTOMAT 16X20 MAGNETIC DRP (DRAPES) IMPLANT
BACTOSHIELD CHG 4% 4OZ (MISCELLANEOUS) ×1
BAG COUNTER SPONGE SURGICOUNT (BAG) IMPLANT
BAG SPEC RTRVL LRG 6X4 10 (ENDOMECHANICALS)
BAG SPNG CNTER NS LX DISP (BAG)
BLADE EXTENDED COATED 6.5IN (ELECTRODE) ×2 IMPLANT
CABLE HIGH FREQUENCY MONO STRZ (ELECTRODE) IMPLANT
CELLS DAT CNTRL 66122 CELL SVR (MISCELLANEOUS) IMPLANT
CHLORAPREP W/TINT 26 (MISCELLANEOUS) ×6 IMPLANT
CLIP TI LARGE 6 (CLIP) ×2 IMPLANT
CLIP TI MEDIUM 6 (CLIP) ×2 IMPLANT
CLIP TI MEDIUM LARGE 6 (CLIP) ×2 IMPLANT
CNTNR URN SCR LID CUP LEK RST (MISCELLANEOUS) IMPLANT
CONT SPEC 4OZ STRL OR WHT (MISCELLANEOUS)
COVER SURGICAL LIGHT HANDLE (MISCELLANEOUS) ×4 IMPLANT
DERMABOND ADVANCED (GAUZE/BANDAGES/DRESSINGS) ×1
DERMABOND ADVANCED .7 DNX12 (GAUZE/BANDAGES/DRESSINGS) ×3 IMPLANT
DRAPE INCISE IOBAN 66X45 STRL (DRAPES) IMPLANT
DRAPE SURG IRRIG POUCH 19X23 (DRAPES) ×4 IMPLANT
DRAPE WARM FLUID 44X44 (DRAPES) ×4 IMPLANT
DRSG OPSITE POSTOP 4X10 (GAUZE/BANDAGES/DRESSINGS) IMPLANT
DRSG OPSITE POSTOP 4X6 (GAUZE/BANDAGES/DRESSINGS) IMPLANT
DRSG OPSITE POSTOP 4X8 (GAUZE/BANDAGES/DRESSINGS) IMPLANT
ELECT REM PT RETURN 15FT ADLT (MISCELLANEOUS) ×6 IMPLANT
GAUZE 4X4 16PLY ~~LOC~~+RFID DBL (SPONGE) IMPLANT
GLOVE SURG ENC MOIS LTX SZ6 (GLOVE) ×16 IMPLANT
GLOVE SURG ENC MOIS LTX SZ6.5 (GLOVE) ×16 IMPLANT
GOWN STRL REUS W/ TWL LRG LVL3 (GOWN DISPOSABLE) ×12 IMPLANT
GOWN STRL REUS W/TWL LRG LVL3 (GOWN DISPOSABLE) ×12
HEMOSTAT ARISTA ABSORB 3G PWDR (HEMOSTASIS) IMPLANT
HOLDER FOLEY CATH W/STRAP (MISCELLANEOUS) IMPLANT
IRRIG SUCT STRYKERFLOW 2 WTIP (MISCELLANEOUS) ×3
IRRIGATION SUCT STRKRFLW 2 WTP (MISCELLANEOUS) ×1 IMPLANT
KIT BASIN OR (CUSTOM PROCEDURE TRAY) ×6 IMPLANT
KIT TURNOVER KIT A (KITS) IMPLANT
LIGASURE IMPACT 36 18CM CVD LR (INSTRUMENTS) IMPLANT
LOOP VESSEL MAXI BLUE (MISCELLANEOUS) IMPLANT
MANIPULATOR UTERINE 4.5 ZUMI (MISCELLANEOUS) IMPLANT
NDL HYPO 21X1.5 SAFETY (NEEDLE) ×4 IMPLANT
NEEDLE HYPO 21X1.5 SAFETY (NEEDLE) ×3 IMPLANT
NS IRRIG 1000ML POUR BTL (IV SOLUTION) ×8 IMPLANT
PACK GENERAL/GYN (CUSTOM PROCEDURE TRAY) ×4 IMPLANT
PAD POSITIONING PINK XL (MISCELLANEOUS) ×4 IMPLANT
POUCH SPECIMEN RETRIEVAL 10MM (ENDOMECHANICALS) IMPLANT
RELOAD PROXIMATE 75MM BLUE (ENDOMECHANICALS) IMPLANT
RELOAD PROXIMATE TA60MM BLUE (ENDOMECHANICALS) IMPLANT
RELOAD STAPLE 60 BLU REG PROX (ENDOMECHANICALS) IMPLANT
RELOAD STAPLE 75 3.8 BLU REG (ENDOMECHANICALS) IMPLANT
RETRACTOR WND ALEXIS 18 MED (MISCELLANEOUS) IMPLANT
RETRACTOR WND ALEXIS 25 LRG (MISCELLANEOUS) IMPLANT
RTRCTR WOUND ALEXIS 18CM MED (MISCELLANEOUS)
RTRCTR WOUND ALEXIS 25CM LRG (MISCELLANEOUS)
SCISSORS LAP 5X35 DISP (ENDOMECHANICALS) IMPLANT
SCRUB CHG 4% DYNA-HEX 4OZ (MISCELLANEOUS) ×5 IMPLANT
SEALER TISSUE G2 CVD JAW 45CM (ENDOMECHANICALS) IMPLANT
SHEET LAVH (DRAPES) ×4 IMPLANT
SLEEVE SUCTION CATH 165 (SLEEVE) ×2 IMPLANT
SLEEVE XCEL OPT CAN 5 100 (ENDOMECHANICALS) ×4 IMPLANT
SOL ANTI FOG 6CC (MISCELLANEOUS) ×1 IMPLANT
SOLUTION ANTI FOG 6CC (MISCELLANEOUS) ×1
SPIKE FLUID TRANSFER (MISCELLANEOUS) ×4 IMPLANT
SPONGE T-LAP 18X18 ~~LOC~~+RFID (SPONGE) IMPLANT
STAPLER GUN LINEAR PROX 60 (STAPLE) IMPLANT
STAPLER PROXIMATE 75MM BLUE (STAPLE) IMPLANT
STAPLER VISISTAT 35W (STAPLE) IMPLANT
SURGIFLO W/THROMBIN 8M KIT (HEMOSTASIS) ×2 IMPLANT
SUT MNCRL AB 4-0 PS2 18 (SUTURE) ×12 IMPLANT
SUT PDS AB 1 TP1 96 (SUTURE) ×4 IMPLANT
SUT SILK 3 0 SH CR/8 (SUTURE) IMPLANT
SUT VIC AB 0 CT1 36 (SUTURE) ×8 IMPLANT
SUT VIC AB 2-0 CT1 27 (SUTURE) ×3
SUT VIC AB 2-0 CT1 27XBRD (SUTURE) ×1 IMPLANT
SUT VIC AB 2-0 CT1 36 (SUTURE) ×4 IMPLANT
SUT VIC AB 2-0 CT1 TAPERPNT 27 (SUTURE) ×4 IMPLANT
SUT VIC AB 2-0 CT2 27 (SUTURE) ×12 IMPLANT
SUT VIC AB 2-0 SH 27 (SUTURE)
SUT VIC AB 2-0 SH 27X BRD (SUTURE) IMPLANT
SUT VIC AB 3-0 CTX 36 (SUTURE) IMPLANT
SUT VIC AB 3-0 SH 18 (SUTURE) IMPLANT
SUT VIC AB 3-0 SH 27 (SUTURE)
SUT VIC AB 3-0 SH 27X BRD (SUTURE) ×2 IMPLANT
SUT VICRYL 4-0 PS2 18IN ABS (SUTURE) ×4 IMPLANT
SYR 20ML LL LF (SYRINGE) ×4 IMPLANT
SYR 30ML LL (SYRINGE) ×4 IMPLANT
SYS RETRIEVAL 5MM INZII UNIV (BASKET)
SYSTEM RETRIEVL 5MM INZII UNIV (BASKET) IMPLANT
TOWEL OR 17X26 10 PK STRL BLUE (TOWEL DISPOSABLE) ×4 IMPLANT
TOWEL OR NON WOVEN STRL DISP B (DISPOSABLE) ×6 IMPLANT
TRAY FOLEY MTR SLVR 16FR STAT (SET/KITS/TRAYS/PACK) ×4 IMPLANT
TRAY LAPAROSCOPIC (CUSTOM PROCEDURE TRAY) ×4 IMPLANT
TROCAR BLADELESS OPT 5 100 (ENDOMECHANICALS) ×4 IMPLANT
TROCAR XCEL 12X100 BLDLESS (ENDOMECHANICALS) ×2 IMPLANT
TROCAR XCEL BLUNT TIP 100MML (ENDOMECHANICALS) IMPLANT
UNDERPAD 30X36 HEAVY ABSORB (UNDERPADS AND DIAPERS) ×4 IMPLANT

## 2021-06-15 NOTE — Anesthesia Procedure Notes (Signed)
Procedure Name: Intubation ?Date/Time: 06/15/2021 7:45 AM ?Performed by: Renato Shin, CRNA ?Pre-anesthesia Checklist: Patient identified, Emergency Drugs available, Suction available and Patient being monitored ?Patient Re-evaluated:Patient Re-evaluated prior to induction ?Oxygen Delivery Method: Circle system utilized ?Preoxygenation: Pre-oxygenation with 100% oxygen ?Induction Type: IV induction ?Ventilation: Mask ventilation without difficulty ?Laryngoscope Size: Glidescope and 3 ?Grade View: Grade II ?Tube type: Oral ?Tube size: 7.0 mm ?Number of attempts: 2 ?Airway Equipment and Method: Rigid stylet and Video-laryngoscopy ?Placement Confirmation: positive ETCO2, breath sounds checked- equal and bilateral and ETT inserted through vocal cords under direct vision ?Secured at: 21 cm ?Tube secured with: Tape ?Dental Injury: Teeth and Oropharynx as per pre-operative assessment  ?Difficulty Due To: Difficulty was anticipated, Difficult Airway- due to reduced neck mobility and Difficult Airway-  due to edematous airway ?Comments: DL x1 Mil3, Grade 3 view. Airway edematous. Unable to pass ETT through glottic opening. Easy mask. DL with Glidescope 3. ETT pass with ease. VSS stable.  ? ? ? ? ?

## 2021-06-15 NOTE — Anesthesia Postprocedure Evaluation (Signed)
Anesthesia Post Note ? ?Patient: Helen Gardner ? ?Procedure(s) Performed: LAPAROSCOPY DIAGNOSTIC WITH WASHOUT (Abdomen) ? ?  ? ?Patient location during evaluation: PACU ?Anesthesia Type: General ?Level of consciousness: awake and alert ?Pain management: pain level controlled ?Vital Signs Assessment: post-procedure vital signs reviewed and stable ?Respiratory status: spontaneous breathing, nonlabored ventilation and respiratory function stable ?Cardiovascular status: blood pressure returned to baseline and stable ?Postop Assessment: no apparent nausea or vomiting ?Anesthetic complications: no ? ? ?No notable events documented. ? ?Last Vitals:  ?Vitals:  ? 06/15/21 1030 06/15/21 1045  ?BP: (!) 126/59 (!) 120/58  ?Pulse: (!) 103 98  ?Resp: 18 14  ?Temp:  37 ?C  ?SpO2: 96% 98%  ?  ?Last Pain:  ?Vitals:  ? 06/15/21 1045  ?TempSrc: Oral  ?PainSc: 0-No pain  ? ? ?  ?  ?  ?  ?  ?  ? ?Lidia Collum ? ? ? ? ?

## 2021-06-15 NOTE — Progress Notes (Signed)
Progress Note ? ?Subjective: ?Patient reports still feels tired and weak. No dizziness but has not been out of bed. Denies nausea or emesis. Mild abdominal pain.   ? ?Objective: ?Vital signs in last 24 hours: ?Temp:  [97.6 ?F (36.4 ?C)-98.6 ?F (37 ?C)] 98 ?F (36.7 ?C) (03/16 0258) ?Pulse Rate:  [72-100] 92 (03/16 0637) ?Resp:  [14-22] 16 (03/16 5277) ?BP: (83-168)/(50-97) 111/53 (03/16 8242) ?SpO2:  [94 %-100 %] 99 % (03/16 3536) ?Weight:  [156 lb (70.8 kg)] 156 lb (70.8 kg) (03/15 2310) ?Last BM Date : 06/14/21 ? ?Intake/Output from previous day: ?03/15 0701 - 03/16 0700 ?In: 555.3 [I.V.:555.3] ?Out: 0  ? ?Physical Examination: ?Gen: NAD ?CV: RRR, no m/r/g ?Pulm: CTAB, no wheezes or rhonchi ?Abd: mildly tender to palpation diffusely, no rebound or guarding. Hypoactive BS. Mildly distended. Incisions c/d/I ?Ext: LEs warm and well perfused, no SCDs. No calf ttp ? ?Labs: ?Hgb on 3/14: 13.9 ?Hgb on 3/15 at 23:48:  10.4 ? ?CBC Latest Ref Rng & Units 06/15/2021 06/15/2021 06/15/2021  ?WBC 4.0 - 10.5 K/uL - 11.7(H) -  ?Hemoglobin 12.0 - 15.0 g/dL 9.3(L) 8.9(L) 10.1(L)  ?Hematocrit 36.0 - 46.0 % 27.6(L) 27.2(L) 30.6(L)  ?Platelets 150 - 400 K/uL - 217 -  ? ?BMP Latest Ref Rng & Units 06/15/2021 06/14/2021 06/14/2021  ?Glucose 70 - 99 mg/dL 141(H) 203(H) 213(H)  ?BUN 8 - 23 mg/dL 10 7(L) 9  ?Creatinine 0.44 - 1.00 mg/dL 0.79 0.70 0.86  ?Sodium 135 - 145 mmol/L 135 139 135  ?Potassium 3.5 - 5.1 mmol/L 4.1 4.1 4.0  ?Chloride 98 - 111 mmol/L 105 105 104  ?CO2 22 - 32 mmol/L 25 - 23  ?Calcium 8.9 - 10.3 mg/dL 7.7(L) - 7.9(L)  ? ? ? ?Assessment: ? ?77 y.o. s/p Procedure(s): plan for surgery today ?LAPAROSCOPY DIAGNOSTIC washout for bleeding ?EXPLORATORY LAPAROTOMY possible ? ?The patient remains hemodynamically stable and H&H appears to have stabilized, but given ongoing symptoms, no UOP (patient has not voided, foley catheter placed by me now) since coming to the hospital, I recommend proceeding with diagnostic surgery. At minimal,  will plan for abdominal washout - suspect there is 1L+ of intra-abdominal blood. Plan for laparoscopy to start, will convert to open if needed for visualization or to achieve hemostasis if ongoing bleeding noted.  ? ? LOS: 0 days  ? ? ?Lafonda Mosses ?06/15/2021, 6:45 AM ? ? ? ?  ? ?

## 2021-06-15 NOTE — Op Note (Signed)
OPERATIVE NOTE ? ?Pre-operative Diagnosis: Acute anemia in the postoperative setting ? ?Post-operative Diagnosis: same, acute blood loss anemia with no active bleeding identified during surgery ? ?Operation: Diagnostic laparoscopy, abdominal washout, placement of Surgiflo over surgical beds, vaginal laceration repair ? ?Surgeon: Jeral Pinch MD ? ?Assistant Surgeon: Joylene John NP ? ?Anesthesia: GET ? ?Urine Output: 240cc since foley catheter placed at approximately 06:15 (concentrated appearing urine) ? ?Operative Findings:  On EUA, cuff intact, minimal blood in the vaginal vault, small amount of bleeding from posterior vaginal laceration.On intra-abdominal entry, approximately 500 cc of blood noted, clots within the pelvis and over the vaginal cuff. The cuff was closely examined as were the pelvic sidewalls and IP ligament remnants. Everything was noted to be hemostatic. This was done again after dropping insufflation to 4-5 mmHg. Given bleeding from vaginal laceration, this was repaired at the end of surgery. ? ?Estimated Blood Loss:  less than 50 mL; estimated that 500 cc of blood with additional clots evacuated from the abdomen and pelvis.     ? ?Total IV Fluids: 1L crystalloid, 1L albumin ?        ?Specimens: none ?        ?Complications:  None apparent; patient tolerated the procedure well. ?        ?Disposition: PACU - hemodynamically stable. ? ?Procedure Details  ?The patient was seen in the Holding Room. The risks, benefits, complications, treatment options, and expected outcomes were discussed with the patient.  The patient concurred with the proposed plan, giving informed consent.  The site of surgery properly noted/marked. The patient was identified as Helen Gardner with plan for diagnostic laparoscopy, possible ex-lap. ? ?After induction of anesthesia, the patient was draped and prepped in the usual sterile manner. Patient was placed in supine position after anesthesia and draped and prepped in  the usual sterile manner as follows: Her arms were tucked to her side with all appropriate precautions.  The shoulders were stabilized with padded shoulder blocks applied to the acromium processes.  The patient was placed in the semi-lithotomy position in [place].  The perineum and vagina were prepped with CholoraPrep. The patient was draped after the CholoraPrep had been allowed to dry for 3 minutes.  A Time Out was held and the above information confirmed. ? ?The vagina and perineum were prepped with betadine. OG tube placement was confirmed and to suction.  ? ?Next, the 10 mm skin incision 1 cm below the subcostal margin in the midclavicular line was opened from recent surgery.  The 5 mm Optiview port and scope was used for direct entry.  Opening pressure was under 10 mm CO2.  The abdomen was insufflated and the findings were noted as above.   At this point and all points during the procedure, the patient's intra-abdominal pressure did not exceed 15 mmHg. Next, a 5 mm trocar was placed int he supraumbilical and the right lateral incisions after they were opened. The patient was placed in steep Trendelenburg.   ? ?The abdomen and pelvis were inspected with findings noted above. A long survey of all surgical beds from surgery yesterday ws performed with no active bleeding noted. The pelvis and surgical beds were copiously irrigated as was the abdomen. No active bleeding was identified during the surgery.  ? ?Surgiflo was placed over the vaginal cuff and bilateral IP ligaments, right pelvic sidewall.  ?  ?At this point in the procedure was completed.  Instruments were removed under direct visualization. The abdomen was desufflated.  The fascia at the 10-12 mm port was closed with 0 Vicryl on a UR-5 needle. The subcuticular tissue was closed with 4-0 Vicryl and the skin was closed with 4-0 Monocryl in a subcuticular manner.  Dermabond was applied.   ? ?The vagina was swabbed with bleeding noted from the  posterior vaginal laceration. This was closed and made hemostatic with a figure of eight stitch using 2-0 Vicryl.   All sponge, lap and needle counts were correct x  3.  ? ?The patient was transferred to the recovery room in stable condition. ? ?Jeral Pinch, MD ? ?

## 2021-06-15 NOTE — ED Notes (Signed)
ED TO INPATIENT HANDOFF REPORT ? ?ED Nurse Name and Phone #: Erick Colace, RN [phone] ? ?S ?Name/Age/Gender ?Helen Gardner ?76 y.o. ?female ?Room/Bed: WA16/WA16 ? ?Code Status ?  Code Status: Full Code ? ?Home/SNF/Other ?Home ?Patient oriented to: self, place, time, and situation ?Is this baseline? Yes  ? ?Triage Complete: Triage complete  ?Chief Complaint ?Syncope [R55] ? ?Triage Note ?Patient BIB EMS from home, pt had hysterectomy at 1 pm. Pt denies complications with procedure or bleeding, states she got up to go to bathroom and became dizzy. Upon EMS arrival BP 80/58.  ? ?22 g LAC, 400 ml NS given en route ?BP improved to 100/57  ? ?Allergies ?Allergies  ?Allergen Reactions  ? Sulfonamide Derivatives Rash  ?  urticarial ?  ? Sulfamethoxazole Hives  ? Hydrocodone   ?  Hyperactivity; heart racing!  ? Hydrocodone-Acetaminophen Palpitations  ? ? ?Level of Care/Admitting Diagnosis ?ED Disposition   ? ? ED Disposition  ?Admit  ? Condition  ?--  ? Comment  ?Hospital Area: [facility] [[id number]] ? Level of Care: Med-Surg [16] ? May place patient in observation at [facility] or [place] if equivalent level of care is available:: Yes ? Covid Evaluation: Confirmed COVID Negative ? Diagnosis: Syncope [206001] ? Admitting Physician: Lafonda Mosses [[id number]] ? Attending Physician: Lafonda Mosses [[id number]] ?  ?  ? ?  ? ? ?B ?Medical/Surgery History ?Past Medical History:  ?Diagnosis Date  ? Chronic cough   ? due to postnasal drainage  ? Chronic fatigue   ? Complex atypical endometrial hyperplasia   ? Fibromyalgia   ? Hyperlipidemia   ? LDL goal = < 120, ideally < 90  ? hypothyroidism   ? IBS (irritable bowel syndrome)   ? intermittent symptoms  ? ?Past Surgical History:  ?Procedure Laterality Date  ? APPENDECTOMY    ? infected, age 30  ? COLONOSCOPY  08/30/2011  ? Dr Sammuel Cooper  ? DILATION AND CURETTAGE OF UTERUS N/A 03/2021  ? TEMPOROMANDIBULAR JOINT SURGERY N/A   ? thyroglossal duct  cystectomy    ? TONSILLECTOMY    ? WISDOM TOOTH EXTRACTION    ?  ? ?A ?IV Location/Drains/Wounds ?Patient Lines/Drains/Airways Status   ? ? Active Line/Drains/Airways   ? ? Name Placement date Placement time Site Days  ? Peripheral IV 06/14/21 22 G Left Antecubital 06/14/21  2330  Antecubital  1  ? Incision - 4 Ports Abdomen 1: Right;Lateral 2: Umbilicus;Superior 3: Left;Lateral;Upper 4: Left;Lateral;Mid 06/14/21  --  -- 1  ? ?  ?  ? ?  ? ? ?Intake/Output Last 24 hours ?No intake or output data in the 24 hours ending 06/15/21 0407 ? ?Labs/Imaging ?Results for orders placed or performed during the hospital encounter of 06/14/21 (from the past 48 hour(s))  ?Comprehensive metabolic panel     Status: Abnormal  ? Collection Time: 06/14/21 11:48 PM  ?Result Value Ref Range  ? Sodium 135 135 - 145 mmol/L  ? Potassium 4.0 3.5 - 5.1 mmol/L  ? Chloride 104 98 - 111 mmol/L  ? CO2 23 22 - 32 mmol/L  ? Glucose, Bld 213 (H) 70 - 99 mg/dL  ?  Comment: Glucose reference range applies only to samples taken after fasting for at least 8 hours.  ? BUN 9 8 - 23 mg/dL  ? Creatinine, Ser 0.86 0.44 - 1.00 mg/dL  ? Calcium 7.9 (L) 8.9 - 10.3 mg/dL  ? Total Protein 6.0 (L) 6.5 - 8.1 g/dL  ?  Albumin 3.2 (L) 3.5 - 5.0 g/dL  ? AST 19 15 - 41 U/L  ? ALT 15 0 - 44 U/L  ? Alkaline Phosphatase 41 38 - 126 U/L  ? Total Bilirubin 0.5 0.3 - 1.2 mg/dL  ? GFR, Estimated >60 >60 mL/min  ?  Comment: (NOTE) ?Calculated using the CKD-EPI Creatinine Equation (2021) ?  ? Anion gap 8 5 - 15  ?  Comment: Performed at [facility], [place] [address] [place], [place] [address]  ?CBC with Differential     Status: Abnormal  ? Collection Time: 06/14/21 11:48 PM  ?Result Value Ref Range  ? WBC 13.6 (H) 4.0 - 10.5 K/uL  ? RBC 3.42 (L) 3.87 - 5.11 MIL/uL  ? Hemoglobin 10.4 (L) 12.0 - 15.0 g/dL  ? HCT 31.6 (L) 36.0 - 46.0 %  ? MCV 92.4 80.0 - 100.0 fL  ? MCH 30.4 26.0 - 34.0 pg  ? MCHC 32.9 30.0 - 36.0 g/dL  ? RDW 13.6 11.5 - 15.5 %  ? Platelets 242 150  - 400 K/uL  ? nRBC 0.0 0.0 - 0.2 %  ? Neutrophils Relative % 89 %  ? Neutro Abs 12.0 (H) 1.7 - 7.7 K/uL  ? Lymphocytes Relative 6 %  ? Lymphs Abs 0.8 0.7 - 4.0 K/uL  ? Monocytes Relative 5 %  ? Monocytes Absolute 0.7 0.1 - 1.0 K/uL  ? Eosinophils Relative 0 %  ? Eosinophils Absolute 0.0 0.0 - 0.5 K/uL  ? Basophils Relative 0 %  ? Basophils Absolute 0.0 0.0 - 0.1 K/uL  ? Immature Granulocytes 0 %  ? Abs Immature Granulocytes 0.05 0.00 - 0.07 K/uL  ?  Comment: Performed at [facility], [place] [address], [place], [place] [address]  ?Protime-INR     Status: None  ? Collection Time: 06/14/21 11:48 PM  ?Result Value Ref Range  ? Prothrombin Time 14.8 11.4 - 15.2 seconds  ? INR 1.2 0.8 - 1.2  ?  Comment: (NOTE) ?INR goal varies based on device and disease states. ?Performed at [facility], [place] [address] ?[place], [place] [address] ?  ?Troponin I (High Sensitivity)     Status: None  ? Collection Time: 06/14/21 11:48 PM  ?Result Value Ref Range  ? Troponin I (High Sensitivity) 3 <18 ng/L  ?  Comment: (NOTE) ?Elevated high sensitivity troponin I (hsTnI) values and significant  ?changes across serial measurements may suggest ACS but many other  ?chronic and acute conditions are known to elevate hsTnI results.  ?Refer to the "Links" section for chest pain algorithms and additional  ?guidance. ?Performed at [facility], [place] [address] ?[place], [place] [address] ?  ?Brain natriuretic peptide     Status: Abnormal  ? Collection Time: 06/14/21 11:49 PM  ?Result Value Ref Range  ? B Natriuretic Peptide 101.3 (H) 0.0 - 100.0 pg/mL  ?  Comment: Performed at [facility], [place] [address] [place],  [address]  ?Resp Panel by RT-PCR (Flu A&B, Covid) Nasopharyngeal Swab     Status: None  ? Collection Time: 06/14/21 11:49 PM  ? Specimen: Nasopharyngeal Swab; Nasopharyngeal(NP) swabs in vial transport medium  ?Result Value Ref Range  ? SARS Coronavirus 2  by RT PCR NEGATIVE NEGATIVE  ?  Comment: (NOTE) ?SARS-CoV-2 target nucleic acids are NOT DETECTED. ? ?The SARS-CoV-2 RNA is generally detectable in upper respiratory ?specimens during the acute phase of infection. The lowest ?concentration of SARS-CoV-2 viral copies this  assay can detect is ?138 copies/mL. A negative result does not preclude SARS-Cov-2 ?infection and should not be used as the sole basis for treatment or ?other patient management decisions. A negative result may occur with  ?improper specimen collection/handling, submission of specimen other ?than nasopharyngeal swab, presence of viral mutation(s) within the ?areas targeted by this assay, and inadequate number of viral ?copies(<138 copies/mL). A negative result must be combined with ?clinical observations, patient history, and epidemiological ?information. The expected result is Negative. ? ?Fact Sheet for Patients:  ?EntrepreneurPulse.com.au ? ?Fact Sheet for Healthcare Providers:  ?IncredibleEmployment.be ? ?This test is no t yet approved or cleared by the [place] FDA and  ?has been authorized for detection and/or diagnosis of SARS-CoV-2 by ?FDA under an Emergency Use Authorization (EUA). This EUA will remain  ?in effect (meaning this test can be used) for the duration of the ?COVID-19 declaration under Section 564(b)(1) of the Act, 21 ?U.S.C.section 360bbb-3(b)(1), unless the authorization is terminated  ?or revoked sooner.  ? ? ?  ? Influenza A by PCR NEGATIVE NEGATIVE  ? Influenza B by PCR NEGATIVE NEGATIVE  ?  Comment: (NOTE) ?The Xpert Xpress SARS-CoV-2/FLU/RSV plus assay is intended as an aid ?in the diagnosis of influenza from Nasopharyngeal swab specimens and ?should not be used as a sole basis for treatment. Nasal washings and ?aspirates are unacceptable for Xpert Xpress SARS-CoV-2/FLU/RSV ?testing. ? ?Fact Sheet for Patients: ?EntrepreneurPulse.com.au ? ?Fact Sheet for Healthcare  Providers: ?IncredibleEmployment.be ? ?This test is not yet approved or cleared by the [place] FDA and ?has been authorized for detection and/or diagnosis of SARS-CoV-2 by ?FDA under an

## 2021-06-15 NOTE — Anesthesia Preprocedure Evaluation (Addendum)
Anesthesia Evaluation  ?Patient identified by MRN, date of birth, ID band ?Patient awake ? ? ? ?Reviewed: ?Allergy & Precautions, NPO status , Patient's Chart, lab work & pertinent test results ? ?Airway ?Mallampati: III ? ?TM Distance: >3 FB ?Neck ROM: Full ? ? ? Dental ? ?(+) Dental Advisory Given ?  ?Pulmonary ?former smoker,  ?  ?breath sounds clear to auscultation ? ? ? ? ? ? Cardiovascular ?negative cardio ROS ? ? ?Rhythm:Regular Rate:Normal ? ? ?  ?Neuro/Psych ?negative neurological ROS ?   ? GI/Hepatic ?negative GI ROS, Neg liver ROS, IBS ?  ?Endo/Other  ?negative endocrine ROS ? Renal/GU ?negative Renal ROS  ? ?  ?Musculoskeletal ? ?(+) Fibromyalgia - ? Abdominal ?  ?Peds ? Hematology ? ?(+) Blood dyscrasia, anemia , Hgb 9.3   ?Anesthesia Other Findings ?Pt is POD1 s/p robotic hysterectomy with postop bleeding. Returns to OR urgently for reexploration. ? Reproductive/Obstetrics ? ?  ? ? ? ? ? ? ? ? ? ? ? ? ? ?  ?  ? ? ? ? ? ? ? ?Anesthesia Physical ? ?Anesthesia Plan ? ?ASA: 3 and emergent ? ?Anesthesia Plan: General  ? ?Post-op Pain Management:   ? ?Induction: Intravenous and Rapid sequence ? ?PONV Risk Score and Plan: 4 or greater and Treatment may vary due to age or medical condition, Midazolam, Ondansetron and Dexamethasone ? ?Airway Management Planned: Oral ETT ? ?Additional Equipment: None ? ?Intra-op Plan:  ? ?Post-operative Plan: Extubation in OR ? ?Informed Consent: I have reviewed the patients History and Physical, chart, labs and discussed the procedure including the risks, benefits and alternatives for the proposed anesthesia with the patient or authorized representative who has indicated his/her understanding and acceptance.  ? ? ? ?Dental advisory given ? ?Plan Discussed with: CRNA ? ?Anesthesia Plan Comments:   ? ? ? ? ? ? ?Anesthesia Quick Evaluation ? ?

## 2021-06-15 NOTE — Progress Notes (Signed)
GYN Oncology Progress Note ? ?Helen Gardner is a 76 yr old s/p robotic-assisted laparoscopic total hysterectomy with bilateral salpingo-oophorectomy, SLN biopsy, cystoscopy on 06/14/21 for CAH with re-operation on 06/15/21 for acute post-operative blood loss anemia with no active bleeding identified during surgery. ? ?S: Pt reports having mild nausea post-op that resolved with zofran administration. No abdominal pain reported. Has a headache develop when sitting upright but this is improving. Denies lightheadedness, chest pain dyspnea, passing flatus. Has not been out of bed.  ? ?O: Alert, oriented, orbital edema improving. Lungs clear. Slightly tachycardic at 104 bpm. Active bowel sounds, abdomen soft. Incisions intact with dermabond. Foley catheter emptied with 1000 cc of clear, yellow urine. ? ?Assessment/Plan: ?Repeat H&H stable- s/p 1 unit PRBCs. Diet advanced to regular. Plan for am labs. Foley to remain overnight for output monitoring. IVF to 100 cc/hr. IS ordered. SCDs on. Advised to ambulate and get out of bed with assist. No needs voiced.    ? ? ?

## 2021-06-15 NOTE — Interval H&P Note (Signed)
History and Physical Interval Note: ? ?06/15/2021 ?7:02 AM ? ?Helen Gardner  has presented today for surgery, with the diagnosis of bleeding.  The various methods of treatment have been discussed with the patient and family. After consideration of risks, benefits and other options for treatment, the patient has consented to  Procedure(s): ?LAPAROSCOPY DIAGNOSTIC washout for bleeding (N/A) ?EXPLORATORY LAPAROTOMY possible (N/A) as a surgical intervention.  The patient's history has been reviewed, patient examined, no change in status, stable for surgery.  I have reviewed the patient's chart and labs.  Questions were answered to the patient's satisfaction.   ? ? ?Helen Gardner ? ? ?

## 2021-06-15 NOTE — Progress Notes (Signed)
Patient has a hemoglobin of 7.9, Tucker MD ordered 1 unit of blood. Blood was started in PACU, report was given to [facility]. Patient as of right now does not need a 2nd unit. Berline Lopes MD also requested that the patient has her SCDs on. Will continue to monitor.  ?

## 2021-06-15 NOTE — H&P (Addendum)
GYNECOLOGIC ONCOLOGY HISTORY AND PHYSICAL ? ?Date of Service: 06/15/21 ? ?HISTORY OF PRESENT ILLNESS: ?Helen Gardner is a 76 y.o. woman who is well known to me. She underwent a TRH/BSO, SLN biopsy on 3/15 for CAH. Surgery was uncomplicated with an EBL of 200 cc. The patient was discharged to home after surgery. She reports doing very well int he recovery room and when she got home. She took Tylenol x1 for mild pain. She was ambulating and had voided. She reports at approximately 9pm getting up to go to the bathroom and becoming dizzy, lightheaded and nauseated. She was downstairs and her husband was upstairs. Because she did not have her glasses with her, it took her some time to be able to see her phone well enough to call him (she tried to call out but given horse voice, he did not hear her). She arrived to the hospital by EMS with reports of blood pressures in 80s/50s. Blood pressures in the ED have been 93-105/56-61, HR in 90s.  ? ?Patient endorses feeling much better than she previously did. She got dizzy again when she tried to stand to void. Now feels tired, no dizziness when in bed. Denies abdominal pain. Minimal vaginal bleeding, has some vaginal burning.  ? ?PAST MEDICAL HISTORY: ?Past Medical History:  ?Diagnosis Date  ? Chronic cough   ? due to postnasal drainage  ? Chronic fatigue   ? Complex atypical endometrial hyperplasia   ? Fibromyalgia   ? Hyperlipidemia   ? LDL goal = < 120, ideally < 90  ? hypothyroidism   ? IBS (irritable bowel syndrome)   ? intermittent symptoms  ? ? ?PAST SURGICAL HISTORY: ?Past Surgical History:  ?Procedure Laterality Date  ? APPENDECTOMY    ? infected, age 59  ? COLONOSCOPY  08/30/2011  ? Dr Sammuel Cooper  ? DILATION AND CURETTAGE OF UTERUS N/A 03/2021  ? TEMPOROMANDIBULAR JOINT SURGERY N/A   ? thyroglossal duct cystectomy    ? TONSILLECTOMY    ? WISDOM TOOTH EXTRACTION    ? ? ?OB/GYN HISTORY: ?OB History  ?Gravida Para Term Preterm AB Living  ?2 2          ?SAB IAB Ectopic  Multiple Live Births  ?           ?  ?# Outcome Date GA Lbr Len/2nd Weight Sex Delivery Anes PTL Lv  ?2 Para           ?1 Para           ? ? ?MEDICATIONS: ? ?Current Facility-Administered Medications:  ?  lactated ringers infusion, , Intravenous, Continuous, Quintella Reichert, MD ?  metoCLOPramide (REGLAN) injection 10 mg, 10 mg, Intravenous, Once, Quintella Reichert, MD ?  ondansetron [facility]) injection 4 mg, 4 mg, Intravenous, Once, Quintella Reichert, MD ?  traMADol Veatrice Bourbon) tablet 50 mg, 50 mg, Oral, Q6H PRN, Lafonda Mosses, MD ? ?Current Outpatient Medications:  ?  ARMOUR THYROID 90 MG tablet, Take 90 mg by mouth in the morning., Disp: , Rfl:  ?  bisacodyl (DULCOLAX) 5 MG EC tablet, Take 15 mg by mouth once., Disp: , Rfl:  ?  cromolyn (NASALCROM) 5.2 MG/ACT nasal spray, Place 1 spray into both nostrils daily., Disp: , Rfl:  ?  fluocinonide cream (LIDEX) 6.21 %, Apply 1 application. topically 4 (four) times a week., Disp: , Rfl:  ?  MAGNESIUM PO, Take 1 tablet by mouth 2 (two) times a week., Disp: , Rfl:  ?  Polyethyl Glycol-Propyl Glycol (SYSTANE  OP), Place 1 drop into both eyes daily as needed (for dry eyes)., Disp: , Rfl:  ?  Psyllium (METAMUCIL PO), Take 1 Scoop by mouth every other day., Disp: , Rfl:  ?  traZODone (DESYREL) 100 MG tablet, Take 100 mg by mouth at bedtime.  , Disp: , Rfl:  ?  VITAMIN D PO, Take 1 tablet by mouth 2 (two) times a week., Disp: , Rfl:  ?  zolpidem (AMBIEN) 5 MG tablet, Take 5 mg by mouth at bedtime as needed for sleep., Disp: , Rfl:  ?  senna-docusate (SENOKOT-S) 8.6-50 MG tablet, Take 2 tablets by mouth at bedtime. For AFTER surgery, do not take if having diarrhea, Disp: 30 tablet, Rfl: 0 ?  traMADol (ULTRAM) 50 MG tablet, Take 1 tablet (50 mg total) by mouth every 6 (six) hours as needed for severe pain. For AFTER surgery only, do not take and drive, Disp: 10 tablet, Rfl: 0 ? ?ALLERGIES: ?Allergies  ?Allergen Reactions  ? Sulfonamide Derivatives Rash  ?  urticarial ?  ?  Sulfamethoxazole Hives  ? Hydrocodone   ?  Hyperactivity; heart racing!  ? Hydrocodone-Acetaminophen Palpitations  ? ? ?FAMILY HISTORY: ?Family History  ?Problem Relation Age of Onset  ? Cancer Mother   ?     colon, pancreatic,  ? Colon cancer Mother 64  ? Pancreatic cancer Mother   ? Cancer Father   ?     lung, smoker  ? Hypertension Brother   ? Hyperlipidemia Brother   ? Colon polyps Brother   ? Lupus Paternal Aunt   ? Arthritis Paternal Aunt   ?      RA in 2; 4 aunts with arthritis  ? Cancer Paternal Aunt   ?     uterine  ? Heart disease Maternal Grandfather   ?     MI @ 79  ? Cancer Maternal Grandfather   ?     pancreatic  ? Pancreatic cancer Maternal Grandfather   ? Hyperlipidemia Paternal Grandmother   ? Stroke Paternal Grandmother   ? Tourette syndrome Son   ? Colon cancer Maternal Great-grandfather   ? Esophageal cancer Neg Hx   ? Rectal cancer Neg Hx   ? Stomach cancer Neg Hx   ? Breast cancer Neg Hx   ? Ovarian cancer Neg Hx   ? Endometrial cancer Neg Hx   ? Prostate cancer Neg Hx   ? ? ?SOCIAL HISTORY: ?Social History  ? ?Socioeconomic History  ? Marital status: Married  ?  Spouse name: Not on file  ? Number of children: Not on file  ? Years of education: Not on file  ? Highest education level: Not on file  ?Occupational History  ? Occupation: retired  ?Tobacco Use  ? Smoking status: Former  ?  Types: Cigarettes  ?  Quit date: 04/02/1998  ?  Years since quitting: 23.2  ? Smokeless tobacco: Never  ? Tobacco comments:  ?  quit 2000  ?Vaping Use  ? Vaping Use: Never used  ?Substance and Sexual Activity  ? Alcohol use: Yes  ?  Alcohol/week: 14.0 standard drinks  ?  Types: 14 Glasses of wine per week  ? Drug use: No  ? Sexual activity: Not Currently  ?Other Topics Concern  ? Not on file  ?Social History Narrative  ? Gets reg exercise  ? ?Social Determinants of Health  ? ?Financial Resource Strain: Not on file  ?Food Insecurity: Not on file  ?Transportation Needs: Not on file  ?Physical  Activity: Not on file   ?Stress: Not on file  ?Social Connections: Not on file  ?Intimate Partner Violence: Not on file  ? ? ?REVIEW OF SYSTEMS: ?Pertinent positive as per HPI ? ?PHYSICAL EXAM: ?BP (!) 98/56   Pulse 90   Temp 98.6 ?F (37 ?C) (Oral)   Resp 20   Ht '5\' 7"'$  (1.702 m)   Wt 156 lb (70.8 kg)   SpO2 96%   BMI 24.43 kg/m?  ?General: Alert, oriented, no acute distress. ?HEENT: Normocephalic, atraumatic.  Sclera anicteric. Some subcutaneous bullae noted involving peri-orbital area.  ?Chest: Clear to auscultation bilaterally. ?Cardiovascular: Regular rate and rhythm, no murmurs, rubs, or gallops. ?Abdomen: Mildly hypoactive bowel sounds.  Soft, mildly distended, mildly/appropriately tender to palpation.  No masses or hepatosplenomegaly appreciated.  No evidence of hernia.  No rebound or guarding. Incisions are clean/dry/intact with dermabond in place. ?Extremities: Grossly normal range of motion.  Warm, well perfused.  No edema bilaterally. ?Skin: No rashes or lesions. ? ?LABORATORY AND RADIOLOGIC DATA: ?CBC ?   ?Component Value Date/Time  ? WBC 13.6 (H) 06/14/2021 2348  ? RBC 3.42 (L) 06/14/2021 2348  ? HGB 10.5 (L) 06/14/2021 2357  ? HCT 31.0 (L) 06/14/2021 2357  ? PLT 242 06/14/2021 2348  ? MCV 92.4 06/14/2021 2348  ? MCH 30.4 06/14/2021 2348  ? MCHC 32.9 06/14/2021 2348  ? RDW 13.6 06/14/2021 2348  ? LYMPHSABS 0.8 06/14/2021 2348  ? MONOABS 0.7 06/14/2021 2348  ? EOSABS 0.0 06/14/2021 2348  ? BASOSABS 0.0 06/14/2021 2348  ? ?BMP Latest Ref Rng & Units 06/14/2021 06/14/2021 06/13/2021  ?Glucose 70 - 99 mg/dL 203(H) 213(H) 101(H)  ?BUN 8 - 23 mg/dL 7(L) 9 12  ?Creatinine 0.44 - 1.00 mg/dL 0.70 0.86 0.62  ?Sodium 135 - 145 mmol/L 139 135 136  ?Potassium 3.5 - 5.1 mmol/L 4.1 4.0 4.7  ?Chloride 98 - 111 mmol/L 105 104 103  ?CO2 22 - 32 mmol/L - 23 27  ?Calcium 8.9 - 10.3 mg/dL - 7.9(L) 9.2  ? ? ?ASSESSMENT AND PLAN: ?Helen Gardner is a 76 y.o. woman with POD#1 from TRH/BSO, SLN biopsy who presented after near syncopal episode  likely due to acute post-operative blood loss. ? ?Overall hemodynamically stable although continues to have some dizziness when tries to ambulate. Hgb down from 13.9 pre-op to 10.5. I suspect she had an acute bleed around

## 2021-06-15 NOTE — Progress Notes (Signed)
Transition of Care (TOC) Screening Note ? ?Patient Details  ?Name: Helen Gardner ?Date of Birth: 28-Feb-1946 ? ?Transition of Care (TOC) CM/SW Contact:    ?Sherie Don, LCSW ?Phone Number: ?06/15/2021, 9:55 AM ? ?Transition of Care Department [facility]) has reviewed patient and no TOC needs have been identified at this time. We will continue to monitor patient advancement through interdisciplinary progression rounds. If new patient transition needs arise, please place a TOC consult. ?

## 2021-06-15 NOTE — Transfer of Care (Signed)
Immediate Anesthesia Transfer of Care Note ? ?Patient: Helen Gardner ? ?Procedure(s) Performed: LAPAROSCOPY DIAGNOSTIC WITH WASHOUT (Abdomen) ? ?Patient Location: PACU ? ?Anesthesia Type:General ? ?Level of Consciousness: awake and patient cooperative ? ?Airway & Oxygen Therapy: Patient Spontanous Breathing and Patient connected to face mask oxygen ? ?Post-op Assessment: Report given to RN and Post -op Vital signs reviewed and stable ? ?Post vital signs: Reviewed and stable ? ?Last Vitals:  ?Vitals Value Taken Time  ?BP 134/74 06/15/21 0938  ?Temp    ?Pulse 111 06/15/21 0942  ?Resp 17 06/15/21 0942  ?SpO2 100 % 06/15/21 0942  ?Vitals shown include unvalidated device data. ? ?Last Pain:  ?Vitals:  ? 06/15/21 0658  ?TempSrc: Oral  ?PainSc:   ?   ? ?Patients Stated Pain Goal: 0 (06/14/21 2309) ? ?Complications: No notable events documented. ?

## 2021-06-16 ENCOUNTER — Encounter (HOSPITAL_COMMUNITY): Payer: Self-pay | Admitting: Gynecologic Oncology

## 2021-06-16 ENCOUNTER — Ambulatory Visit: Payer: Medicare Other | Admitting: Gynecologic Oncology

## 2021-06-16 DIAGNOSIS — Z885 Allergy status to narcotic agent status: Secondary | ICD-10-CM | POA: Diagnosis not present

## 2021-06-16 DIAGNOSIS — E785 Hyperlipidemia, unspecified: Secondary | ICD-10-CM | POA: Diagnosis not present

## 2021-06-16 DIAGNOSIS — Y92234 Operating room of hospital as the place of occurrence of the external cause: Secondary | ICD-10-CM | POA: Diagnosis not present

## 2021-06-16 DIAGNOSIS — Z83438 Family history of other disorder of lipoprotein metabolism and other lipidemia: Secondary | ICD-10-CM | POA: Diagnosis not present

## 2021-06-16 DIAGNOSIS — Z882 Allergy status to sulfonamides status: Secondary | ICD-10-CM | POA: Diagnosis not present

## 2021-06-16 DIAGNOSIS — R5382 Chronic fatigue, unspecified: Secondary | ICD-10-CM | POA: Diagnosis present

## 2021-06-16 DIAGNOSIS — I959 Hypotension, unspecified: Secondary | ICD-10-CM | POA: Diagnosis not present

## 2021-06-16 DIAGNOSIS — Z79899 Other long term (current) drug therapy: Secondary | ICD-10-CM | POA: Diagnosis not present

## 2021-06-16 DIAGNOSIS — D62 Acute posthemorrhagic anemia: Secondary | ICD-10-CM | POA: Diagnosis not present

## 2021-06-16 DIAGNOSIS — Y836 Removal of other organ (partial) (total) as the cause of abnormal reaction of the patient, or of later complication, without mention of misadventure at the time of the procedure: Secondary | ICD-10-CM | POA: Diagnosis not present

## 2021-06-16 DIAGNOSIS — N8502 Endometrial intraepithelial neoplasia [EIN]: Secondary | ICD-10-CM | POA: Diagnosis present

## 2021-06-16 DIAGNOSIS — Z780 Asymptomatic menopausal state: Secondary | ICD-10-CM | POA: Diagnosis not present

## 2021-06-16 DIAGNOSIS — Z79890 Hormone replacement therapy (postmenopausal): Secondary | ICD-10-CM | POA: Diagnosis not present

## 2021-06-16 DIAGNOSIS — S3141XA Laceration without foreign body of vagina and vulva, initial encounter: Secondary | ICD-10-CM | POA: Diagnosis not present

## 2021-06-16 DIAGNOSIS — R0982 Postnasal drip: Secondary | ICD-10-CM | POA: Diagnosis present

## 2021-06-16 DIAGNOSIS — Z9049 Acquired absence of other specified parts of digestive tract: Secondary | ICD-10-CM | POA: Diagnosis not present

## 2021-06-16 DIAGNOSIS — R350 Frequency of micturition: Secondary | ICD-10-CM | POA: Diagnosis present

## 2021-06-16 DIAGNOSIS — E039 Hypothyroidism, unspecified: Secondary | ICD-10-CM | POA: Diagnosis not present

## 2021-06-16 DIAGNOSIS — R053 Chronic cough: Secondary | ICD-10-CM | POA: Diagnosis present

## 2021-06-16 DIAGNOSIS — K649 Unspecified hemorrhoids: Secondary | ICD-10-CM | POA: Diagnosis not present

## 2021-06-16 DIAGNOSIS — M797 Fibromyalgia: Secondary | ICD-10-CM | POA: Diagnosis present

## 2021-06-16 DIAGNOSIS — R55 Syncope and collapse: Secondary | ICD-10-CM | POA: Diagnosis not present

## 2021-06-16 DIAGNOSIS — Z20822 Contact with and (suspected) exposure to covid-19: Secondary | ICD-10-CM | POA: Diagnosis not present

## 2021-06-16 DIAGNOSIS — K589 Irritable bowel syndrome without diarrhea: Secondary | ICD-10-CM | POA: Diagnosis present

## 2021-06-16 LAB — GLUCOSE, CAPILLARY: Glucose-Capillary: 113 mg/dL — ABNORMAL HIGH (ref 70–99)

## 2021-06-16 LAB — BASIC METABOLIC PANEL
Anion gap: 7 (ref 5–15)
BUN: 7 mg/dL — ABNORMAL LOW (ref 8–23)
CO2: 23 mmol/L (ref 22–32)
Calcium: 8.2 mg/dL — ABNORMAL LOW (ref 8.9–10.3)
Chloride: 110 mmol/L (ref 98–111)
Creatinine, Ser: 0.52 mg/dL (ref 0.44–1.00)
GFR, Estimated: >60 mL/min (ref >60)
Glucose, Bld: 104 mg/dL — ABNORMAL HIGH (ref 70–99)
Potassium: 4.3 mmol/L (ref 3.5–5.1)
Sodium: 140 mmol/L (ref 135–145)

## 2021-06-16 LAB — CBC
HCT: 30.9 % — ABNORMAL LOW (ref 36.0–46.0)
Hemoglobin: 10.3 g/dL — ABNORMAL LOW (ref 12.0–15.0)
MCH: 29.4 pg (ref 26.0–34.0)
MCHC: 33.3 g/dL (ref 30.0–36.0)
MCV: 88.3 fL (ref 80.0–100.0)
Platelets: 137 10*3/uL — ABNORMAL LOW (ref 150–400)
RBC: 3.5 MIL/uL — ABNORMAL LOW (ref 3.87–5.11)
RDW: 15.7 % — ABNORMAL HIGH (ref 11.5–15.5)
WBC: 10.6 10*3/uL — ABNORMAL HIGH (ref 4.0–10.5)
nRBC: 0 % (ref 0.0–0.2)

## 2021-06-16 LAB — SURGICAL PATHOLOGY

## 2021-06-16 MED ORDER — PROCHLORPERAZINE EDISYLATE 10 MG/2ML IJ SOLN
5.0000 mg | Freq: Four times a day (QID) | INTRAMUSCULAR | Status: DC | PRN
  Administered 2021-06-16 – 2021-06-17 (×2): 10 mg via INTRAVENOUS
  Filled 2021-06-16 (×2): qty 2

## 2021-06-16 NOTE — Progress Notes (Signed)
GYN Oncology Progress Note ? ?Helen Gardner is a 76 yr old s/p robotic-assisted laparoscopic total hysterectomy with bilateral salpingo-oophorectomy, SLN biopsy, cystoscopy on 06/14/21 for CAH with re-operation on 06/15/21 for acute post-operative blood loss anemia with no active bleeding identified during surgery. ? ?Subjective: ?Patient reports nausea this am. No emesis reported. States she ate solid food fast last pm bc she was hungry then felt full and uncomfortable. No significant abdominal pain reported. She ambulated in the halls yesterday and tolerated well without dizziness, lightheadedness. Has mild headache. Can feel the gas moving her in abdomen with some flatus. No burping.   ? ?Objective: ?Vital signs in last 24 hours: ?Temp:  [98 ?F (36.7 ?C)-99.2 ?F (37.3 ?C)] 98.5 ?F (36.9 ?C) (03/17 0529) ?Pulse Rate:  [73-110] 73 (03/17 0529) ?Resp:  [14-18] 17 (03/17 0529) ?BP: (116-135)/(51-74) 116/55 (03/17 0529) ?SpO2:  [96 %-100 %] 97 % (03/17 0529) ?Last BM Date : 06/14/21 ? ?Intake/Output from previous day: ?03/16 0701 - 03/17 0700 ?In: 5318.7 [P.O.:1180; I.V.:3192.7; Blood:346; IV Piggyback:600] ?Out: 4580 [DXIPJ:8250; Blood:50] ? ?Physical Examination: ?General: alert, cooperative, and no distress ?Resp: clear to auscultation bilaterally ?Cardio: regular rate and rhythm, S1, S2 normal, no murmur, click, rub or gallop ?GI: soft, non-tender; bowel sounds normal; no masses,  no organomegaly, incision: clean and abdominal incisions with dermabond intact without drainage, mild ecchymosis surrounding incisions, and appropriately tender on palpation. ?Extremities: extremities normal, atraumatic, no cyanosis or edema ? ?Labs: ?WBC/Hgb/Hct/Plts:  10.6/10.3/30.9/137 (03/17 5397) BUN/Cr/glu/ALT/AST/amyl/lip:  7/0.52/--/--/--/--/-- (03/17 0428) ? ?Assessment: ?76 y.o. s/p Procedure(s): ?S/p robotic-assisted laparoscopic total hysterectomy with bilateral salpingo-oophorectomy, SLN biopsy, cystoscopy on 06/14/21 for CAH  with re-operation on 06/15/21 for acute post-operative blood loss anemia with no active bleeding identified during surgery: stable. ? ?Pain:  Pain is well-controlled on PRN medications. ? ?Heme: Hgb 10.3 and Hct 30.9-stable and appropriate. Acute post-operative blood loss anemia after surgery on 06/14/21, re-operation on 06/15/21 with no active bleeding identified. S/P 1 unit of PRBCs on 06/15/21. ? ?CV: BP and HR stable. Tachycardia improved. Continue monitoring with ordered vital signs. ? ?GI:  Tolerating po: minimal amount this am due to nausea. Antiemetics ordered PRN.  ? ?GU: Adequate urine output documented. Creatinine 0.52 this am.    ? ?FEN: No critical values on am Bmet.  ? ?Prophylaxis: SCDs on. No pharm prop ordered due to recent acute post-op bleed. ? ?Plan: ?Discontinue foley ?IV to saline lock if taking in adequate oral intake ?Antiemetics ordered prn ?Continue plan of care per Dr. Berline Lopes ?Possible discharge later today if nausea improved, tolerating regular diet, ambulating, voiding after foley removal.  ? ? LOS: 0 days  ? ? ?Helen Gardner Helen Gardner ?06/16/2021, 8:53 AM ? ? ? ?   ?

## 2021-06-16 NOTE — Progress Notes (Signed)
GYN Oncology Progress Note ? ?Patient laying in bed in no acute distress. States she "just woke up." She had an episode this afternoon where her legs were shaky when ambulating to the bathroom but she did not feel lightheaded or dizzy. She has only taken in small bites of food. Has intermittent nausea. During our conversation, with elevating the head of bed, she became nauseated. No emesis reported. States she feels tired. She is passing flatus and no BM reported.  ? ?Alert, oriented x 3, facial edema improving. Lungs clear. Heart rate regular in rate and rhythm. Abdomen soft, active bowel sounds. Incisions to the abdomen with dermabond intact. No lower extrem edema. ? ?RN notified to obtain antiemetic. Pt feels she is drinking enough to stay hydrated. If nausea persists, IVF will be resumed. Am labs ordered. Continue plan of care per Dr. Berline Lopes. Continue with strict intake and output.  ?

## 2021-06-17 LAB — BASIC METABOLIC PANEL
Anion gap: 5 (ref 5–15)
BUN: 6 mg/dL — ABNORMAL LOW (ref 8–23)
CO2: 29 mmol/L (ref 22–32)
Calcium: 8.6 mg/dL — ABNORMAL LOW (ref 8.9–10.3)
Chloride: 105 mmol/L (ref 98–111)
Creatinine, Ser: 0.62 mg/dL (ref 0.44–1.00)
GFR, Estimated: >60 mL/min (ref >60)
Glucose, Bld: 105 mg/dL — ABNORMAL HIGH (ref 70–99)
Potassium: 3.5 mmol/L (ref 3.5–5.1)
Sodium: 139 mmol/L (ref 135–145)

## 2021-06-17 LAB — CBC
HCT: 28.2 % — ABNORMAL LOW (ref 36.0–46.0)
Hemoglobin: 9.3 g/dL — ABNORMAL LOW (ref 12.0–15.0)
MCH: 29.7 pg (ref 26.0–34.0)
MCHC: 33 g/dL (ref 30.0–36.0)
MCV: 90.1 fL (ref 80.0–100.0)
Platelets: 180 10*3/uL (ref 150–400)
RBC: 3.13 MIL/uL — ABNORMAL LOW (ref 3.87–5.11)
RDW: 14.6 % (ref 11.5–15.5)
WBC: 10.9 10*3/uL — ABNORMAL HIGH (ref 4.0–10.5)
nRBC: 0 % (ref 0.0–0.2)

## 2021-06-17 MED ORDER — BUTALBITAL-APAP-CAFFEINE 50-325-40 MG PO TABS
1.0000 | ORAL_TABLET | Freq: Four times a day (QID) | ORAL | Status: DC | PRN
  Filled 2021-06-17: qty 1

## 2021-06-17 NOTE — Progress Notes (Signed)
2 Days Post-Op Procedure(s) (LRB): ?LAPAROSCOPY DIAGNOSTIC WITH WASHOUT (N/A) ? ?Subjective: ?Patient reports some improvement although still feels tired.  Reports continued headache which she describes as unilateral on the right side.  She continues to have nausea although this is better currently.  Is tolerating liquids.  Has consumed small amount of p.o.  Denies any emesis.  Voiding freely without difficulty.  Denies any vaginal bleeding. + Flatus, denies bowel movement.  Ambulating to chair and bathroom with assistance. ? ?Objective: ?Vital signs in last 24 hours: ?Temp:  [97.7 ?F (36.5 ?C)-98.6 ?F (37 ?C)] 97.7 ?F (36.5 ?C) (03/18 0800) ?Pulse Rate:  [68-93] 70 (03/18 0800) ?Resp:  [14-18] 16 (03/18 0800) ?BP: (132-149)/(62-85) 149/68 (03/18 0800) ?SpO2:  [94 %-100 %] 100 % (03/18 0800) ?Last BM Date : 06/14/21 ? ?Intake/Output from previous day: ?03/17 0701 - 03/18 0700 ?In: 1860 [P.O.:1860] ?Out: 2200 [Urine:2200] ? ?Physical Examination: ?General: alert, cooperative, and no distress ?Resp: clear to auscultation bilaterally ?Cardio: regular rate and rhythm, S1, S2 normal, no murmur, click, rub or gallop ?GI: soft, mildly distended, non-tender, bowel sounds mildly hypoactive, incisions are clean with dermabond intact, mild ecchymosis surrounding incisions ?Extremities: extremities normal, atraumatic, no cyanosis or edema ? ?Labs: ?CBC ?   ?Component Value Date/Time  ? WBC 10.9 (H) 06/17/2021 0358  ? RBC 3.13 (L) 06/17/2021 0358  ? HGB 9.3 (L) 06/17/2021 0358  ? HCT 28.2 (L) 06/17/2021 0358  ? PLT 180 06/17/2021 0358  ? MCV 90.1 06/17/2021 0358  ? MCH 29.7 06/17/2021 0358  ? MCHC 33.0 06/17/2021 0358  ? RDW 14.6 06/17/2021 0358  ? LYMPHSABS 0.8 06/14/2021 2348  ? MONOABS 0.7 06/14/2021 2348  ? EOSABS 0.0 06/14/2021 2348  ? BASOSABS 0.0 06/14/2021 2348  ? ?BMP Latest Ref Rng & Units 06/17/2021 06/16/2021 06/15/2021  ?Glucose 70 - 99 mg/dL 105(H) 104(H) 150(H)  ?BUN 8 - 23 mg/dL 6(L) 7(L) 7(L)  ?Creatinine 0.44 -  1.00 mg/dL 0.62 0.52 0.59  ?Sodium 135 - 145 mmol/L 139 140 140  ?Potassium 3.5 - 5.1 mmol/L 3.5 4.3 4.5  ?Chloride 98 - 111 mmol/L 105 110 107  ?CO2 22 - 32 mmol/L '29 23 26  '$ ?Calcium 8.9 - 10.3 mg/dL 8.6(L) 8.2(L) 8.0(L)  ? ?Assessment: ? ?76 y.o. s/p Procedure(s): ?Robotic total hysterectomy with BSO, bilateral SLN biopsy on 3/15 ?LAPAROSCOPY DIAGNOSTIC WITH WASHOUT on 3/16: progressing slowly ? ?Post-op: minimal pain, controlled with non narcotic medications. Tolerating liquids, encouraged increase solid intake with improved nausea control. + flatus. Ambulating. Good UOP.  ? ?Acute anemia secondary to surgical blood loss: s/p 1 u pRBC on 3/16. Hgb rose appropriately, no objective evidence of ongoing bleeding. Discussed initiating iron on discharge and once bowel movements normal given risk of constipation.  ? ?Headache: no significant relief with Tylenol. Patient usually consumes 2-3 cups of caffeine daily, has not had coffee in multiple days due to surgeries. Recommended that we try Fioricet as patient does not have interest in trying coffee.  ? ?Nausea: benign abdominal exam, +BS, + flatus. Patient at high risk of post-operative ileus although with evidence of bowel function. No emesis. Continue to treat with prn anti-emetics.  ? ?Prophylaxis: ambulation, SCDs.  ? ?Plan: ?Continue to work towards post-op goals. If improvement in nausea and headache, as well as increased PO intake, may be appropriate for dc later today.  ?The patient is to be discharged to home. ? ? LOS: 1 day  ? ? ?Lafonda Mosses ?06/17/2021, 9:19 AM ? ? ? ?  ? ?

## 2021-06-17 NOTE — Progress Notes (Signed)
Pt refused Fioricet earlier today bc she was afraid that it would make her nauseated. Gave pt saltine crackers, g. ale and po Zofran, then offered Fioricet later and pt still refused. Gave pt a cup of coffee but only took a few sips. Po intake remains poor. Pt also asking for Tylenol as often as she can have it. When asked what her pain number is pt stated she had no pain at that time, but was worried that her head would start hurting again later. Will cont to monitor. ?

## 2021-06-18 MED ORDER — ONDANSETRON HCL 4 MG PO TABS: 4.0000 mg | ORAL_TABLET | Freq: Three times a day (TID) | ORAL | 0 refills | Status: DC | PRN

## 2021-06-18 MED ORDER — FERROUS SULFATE 325 (65 FE) MG PO TBEC: 325.0000 mg | DELAYED_RELEASE_TABLET | Freq: Every day | ORAL | 0 refills | Status: DC

## 2021-06-18 NOTE — Progress Notes (Signed)
3 Days Post-Op Procedure(s) (LRB): ?LAPAROSCOPY DIAGNOSTIC WITH WASHOUT (N/A) ? ?Subjective: ?Patient reports feeling better this morning.  She had a headache during most of the day but ultimately headache went away overnight.  Given her fatigue yesterday, she slept during the day and had difficulty falling asleep until about 5 AM this morning.  She then began having a headache again which she thinks was related to being hungry.  Eating breakfast this morning helped.  Nausea significantly improved and not bothersome.  Continues to endorse flatus, feels like she will have a bowel movement soon.  Minimal abdominal pain.  Voiding without difficulty.  Feels more steady on her feet moving around.  Denies any dizziness with ambulation. ? ?Objective: ?Vital signs in last 24 hours: ?Temp:  [97 ?F (36.1 ?C)-98.7 ?F (37.1 ?C)] 97 ?F (36.1 ?C) (03/19 0547) ?Pulse Rate:  [73-86] 77 (03/19 0547) ?Resp:  [15-20] 16 (03/19 0547) ?BP: (120-154)/(63-76) 129/63 (03/19 0547) ?SpO2:  [97 %-99 %] 97 % (03/19 0547) ?Last BM Date : 06/14/21 ? ?Intake/Output from previous day: ?03/18 0701 - 03/19 0700 ?In: 1280 [P.O.:1280] ?Out: 4250 [Urine:4250] ? ?Physical Examination: ?General: alert, cooperative, and no distress; improvement of subcutaneous emphysema around her eyes and chest ?Resp: clear to auscultation bilaterally ?Cardio: regular rate and rhythm, S1, S2 normal, no murmur, click, rub or gallop ?GI: soft, non distended, non-tender, bowel sounds normoactive, incisions are clean with dermabond intact, mild ecchymosis surrounding incisions, ecchymoses also noted along the left flank ?Extremities: extremities normal, atraumatic, no cyanosis or edema ? ?Labs: ?   No new labs today ? ? ?Assessment: ? ?76 y.o. s/p Procedure(s): ?Robotic total hysterectomy with BSO, bilateral SLN biopsy on 3/15 ?LAPAROSCOPY DIAGNOSTIC WITH WASHOUT on 3/16: progressing  ? ?Post-op: minimal pain, controlled with PO medications. Tolerating liquids, improved  intake of solids, ate at least half of her breakfast, nausea significantly improved. + flatus. Ambulating. Good UOP.  ?  ?Acute anemia secondary to surgical blood loss: s/p 1 u pRBC on 3/16. Hgb rose appropriately, no objective evidence of ongoing bleeding. Discussed initiating iron on discharge and once bowel movements normal given risk of constipation.  ?  ?Headache: Patient was nervous about taking Fioricet yesterday.  Had some caffeine during the day, had caffeine this morning.  Headache seems to be at least partially related to hunger, improved after she ate. ?  ?Nausea: Significantly improved.  Benign abdominal exam, +BS, + flatus.  ?  ?Prophylaxis: ambulation, SCDs.  ? ?Plan: ?Meeting postoperative milestones, feels ready for discharge and would like to go home today ?The patient is to be discharged to home. ? ? LOS: 2 days  ? ? ?Lafonda Mosses ?06/18/2021, 10:12 AM ? ? ? ?  ? ?

## 2021-06-18 NOTE — Discharge Summary (Signed)
?Physician Discharge Summary  ?Patient ID: ?Helen Gardner ?MRN: [id number] ?DOB/AGE: [birth year] 76 y.o. ? ?Admit date: 06/14/2021 ?Discharge date: 06/18/2021 ? ?Admission Diagnoses: Acute blood loss as cause of postoperative anemia ? ?Discharge Diagnoses:  ?Principal Problem: ?  Acute blood loss as cause of postoperative anemia ?Active Problems: ?  Syncope ?  Postoperative anemia due to acute blood loss ? ? ?Discharged Condition:  The patient is in good condition and stable for discharge.   ? ?Hospital Course:  ?76 yo presented on POD#0 from a TRH/BSO and bilateral SLN biopsy for CAH after near syncopal episode. She was found to be hypotensive with Bps of 80s/50s by EMS. She was found to have acute anemia secondary to postoperative blood loss. Although H&H appeared to stabilize, given low UOP, continued dizziness, and suspected hemoperitoneum, recommendation was made to proceed to surgery. The patient underwent diagnostic laparoscopy with findings of 500 cc of intra-abdominal blood and clots although no active bleeding found. The patient received 1u of pRBCs for a hemoglobin of 7.9 in the recovery room. By postoperative day #3 from take back surgery, the patient was tolerating PO without nausea, voiding freely, endorsing flatus, ambulating, and denying significant pain.  She was discharged to home in stable condition.  ? ?Consults: none ? ?Significant Diagnostic Studies:  ?CBC ?   ?Component Value Date/Time  ? WBC 10.9 (H) 06/17/2021 0358  ? RBC 3.13 (L) 06/17/2021 0358  ? HGB 9.3 (L) 06/17/2021 0358  ? HCT 28.2 (L) 06/17/2021 0358  ? PLT 180 06/17/2021 0358  ? MCV 90.1 06/17/2021 0358  ? MCH 29.7 06/17/2021 0358  ? MCHC 33.0 06/17/2021 0358  ? RDW 14.6 06/17/2021 0358  ? LYMPHSABS 0.8 06/14/2021 2348  ? MONOABS 0.7 06/14/2021 2348  ? EOSABS 0.0 06/14/2021 2348  ? BASOSABS 0.0 06/14/2021 2348  ? ?BMP Latest Ref Rng & Units 06/17/2021 06/16/2021 06/15/2021  ?Glucose 70 - 99 mg/dL 105(H) 104(H) 150(H)  ?BUN 8 - 23  mg/dL 6(L) 7(L) 7(L)  ?Creatinine 0.44 - 1.00 mg/dL 0.62 0.52 0.59  ?Sodium 135 - 145 mmol/L 139 140 140  ?Potassium 3.5 - 5.1 mmol/L 3.5 4.3 4.5  ?Chloride 98 - 111 mmol/L 105 110 107  ?CO2 22 - 32 mmol/L '29 23 26  '$ ?Calcium 8.9 - 10.3 mg/dL 8.6(L) 8.2(L) 8.0(L)  ? ? ?Treatments: surgery, IVFs ? ?Discharge Exam: ?Blood pressure 135/70, pulse 80, temperature 98.2 ?F (36.8 ?C), temperature source Oral, resp. rate 15, height '5\' 7"'$  (1.702 m), weight 156 lb (70.8 kg), SpO2 99 %. ?See daily progress note for physical exam ? ?Disposition: Discharge disposition: 01-Home or Self Care ? ? ? ? ? ? ? ?Allergies as of 06/18/2021   ? ?   Reactions  ? Sulfonamide Derivatives Rash  ? urticarial  ? Sulfamethoxazole Hives  ? Hydrocodone   ? Hyperactivity; heart racing!  ? Hydrocodone-acetaminophen Palpitations  ? ?  ? ?  ?Medication List  ?  ? ?TAKE these medications   ? ?Armour Thyroid 90 MG tablet ?Generic drug: thyroid ?Take 90 mg by mouth in the morning. ?  ?bisacodyl 5 MG EC tablet ?Commonly known as: DULCOLAX ?Take 15 mg by mouth once. ?  ?cromolyn 5.2 MG/ACT nasal spray ?Commonly known as: NASALCROM ?Place 1 spray into both nostrils daily. ?  ?ferrous sulfate 325 (65 FE) MG EC tablet ?Take 1 tablet (325 mg total) by mouth daily with breakfast. ?  ?fluocinonide cream 0.05 % ?Commonly known as: LIDEX ?Apply 1 application. topically 4 (four) times  a week. ?  ?MAGNESIUM PO ?Take 1 tablet by mouth 2 (two) times a week. ?  ?METAMUCIL PO ?Take 1 Scoop by mouth every other day. ?  ?ondansetron 4 MG tablet ?Commonly known as: Zofran ?Take 1 tablet (4 mg total) by mouth every 8 (eight) hours as needed for up to 8 doses for nausea or vomiting. ?  ?senna-docusate 8.6-50 MG tablet ?Commonly known as: Senokot-S ?Take 2 tablets by mouth at bedtime. For AFTER surgery, do not take if having diarrhea ?  ?SYSTANE OP ?Place 1 drop into both eyes daily as needed (for dry eyes). ?  ?traMADol 50 MG tablet ?Commonly known as: ULTRAM ?Take 1 tablet (50  mg total) by mouth every 6 (six) hours as needed for severe pain. For AFTER surgery only, do not take and drive ?  ?traZODone 100 MG tablet ?Commonly known as: DESYREL ?Take 100 mg by mouth at bedtime. ?  ?VITAMIN D PO ?Take 1 tablet by mouth 2 (two) times a week. ?  ?zolpidem 5 MG tablet ?Commonly known as: AMBIEN ?Take 5 mg by mouth at bedtime as needed for sleep. ?  ? ?  ? ? ? ?Greater than thirty minutes were spend for face to face discharge instructions and discharge orders/summary in EPIC.  ? ?Signed: ?Lafonda Mosses ?06/18/2021, 3:38 PM ? ? ? ? ? ?

## 2021-06-18 NOTE — Discharge Instructions (Addendum)
06/18/2021 ? ?Activity: ?1. Be up and out of the bed during the day.  Take a nap if needed.  You may walk up steps but be careful and use the hand rail.  Stair climbing will tire you more than you think, you may need to stop part way and rest.  ? ?2. No lifting or straining for 6 weeks. ? ?3. No driving for until off narcotics and you can brake safely. For most patients, this is 1-2 weeks.  Do Not drive if you are taking narcotic pain medicine. ? ?4. Shower daily.  Use soap and water on your incision and pat dry; don't rub.  ? ?5. No sexual activity and nothing in the vagina for 8+ weeks. ? ?Medications:  ?- Take ibuprofen and tylenol first line for pain control. Take these regularly (every 6 hours) to decrease the build up of pain. ? ?- If necessary, for severe pain not relieved by ibuprofen, take tramadol. ? ?- While taking tramadol you should take sennakot every night to reduce the likelihood of constipation. If this causes diarrhea, stop its use. ? ?Diet: ?1. Low sodium Heart Healthy Diet is recommended. ? ?2. It is safe to use a laxative if you have difficulty moving your bowels.  ? ?Wound Care: ?1. Keep clean and dry.  Shower daily. ? ?Reasons to call the Doctor: ? ?Fever - Oral temperature greater than 100.4 degrees Fahrenheit ?Foul-smelling vaginal discharge ?Difficulty urinating ?Nausea and vomiting ?Increased pain at the site of the incision that is unrelieved with pain medicine. ?Difficulty breathing with or without chest pain ?New calf pain especially if only on one side ?Sudden, continuing increased vaginal bleeding with or without clots. ?  ?Follow-up: ?1. See Jeral Pinch as scheduled on 4/10. Your 3/22 phone visit will be cancelled since your pathology has already returned. ? ?Contacts: ?For questions or concerns you should contact: ? ?Dr. Jeral Pinch at [phone] ?After hours and on week-ends call [phone] and ask to speak to the physician on call for Gynecologic Oncology ? ? ? ?

## 2021-06-18 NOTE — Progress Notes (Signed)
Discharge instructions given to patient and all questions were answered.  

## 2021-06-19 ENCOUNTER — Telehealth: Payer: Self-pay | Admitting: *Deleted

## 2021-06-19 LAB — BPAM RBC
Blood Product Expiration Date: 202303292359
Blood Product Expiration Date: 202303292359
ISSUE DATE / TIME: 202303161022
Unit Type and Rh: 9500
Unit Type and Rh: 9500

## 2021-06-19 LAB — TYPE AND SCREEN
ABO/RH(D): O NEG
Antibody Screen: NEGATIVE
Unit division: 0
Unit division: 0

## 2021-06-19 NOTE — Telephone Encounter (Signed)
Spoke with Helen Gardner this morning. She states she is eating, drinking and urinating well. She has not had a BM yet but is passing gas. She is taking senokot as prescribed and encouraged her to drink plenty of water. She denies fever or chills. Incisions are dry and intact. She rates her pain 1/10. Her pain is controlled with tylenol.    ? ?Instructed to call office with any fever, chills, purulent drainage, uncontrolled pain or any other questions or concerns. Patient verbalizes understanding.  ? ?Pt aware of post op appointments as well as the office number [phone] and after hours number [phone] to call if she has any questions or concerns  ? ?

## 2021-06-21 ENCOUNTER — Telehealth: Payer: Self-pay | Admitting: Gynecologic Oncology

## 2021-06-21 ENCOUNTER — Inpatient Hospital Stay: Payer: Medicare HMO | Admitting: Gynecologic Oncology

## 2021-06-21 NOTE — Telephone Encounter (Signed)
Patient reports doing well. Denies significant pain, vaginal bleeding. Bowels are moving, discussed decreasing or stopping sennakot since bowel somewhat over active. Also recommended starting iron. Feels fatigued, reassured her that this is normal in the setting of major surgery and blood loss. Restrictions and expectations reviewed. ? ?Jeral Pinch MD ?Gynecologic Oncology ? ?

## 2021-07-10 ENCOUNTER — Inpatient Hospital Stay: Payer: Medicare HMO | Attending: Gynecologic Oncology | Admitting: Gynecologic Oncology

## 2021-07-10 ENCOUNTER — Other Ambulatory Visit: Payer: Self-pay

## 2021-07-10 ENCOUNTER — Encounter: Payer: Self-pay | Admitting: Gynecologic Oncology

## 2021-07-10 VITALS — BP 136/66 | HR 74 | Temp 97.8°F | Resp 18 | Ht 66.54 in | Wt 154.0 lb

## 2021-07-10 DIAGNOSIS — N8502 Endometrial intraepithelial neoplasia [EIN]: Secondary | ICD-10-CM

## 2021-07-10 DIAGNOSIS — D62 Acute posthemorrhagic anemia: Secondary | ICD-10-CM

## 2021-07-10 DIAGNOSIS — Z7189 Other specified counseling: Secondary | ICD-10-CM

## 2021-07-10 NOTE — Progress Notes (Signed)
Gynecologic Oncology Return Clinic Visit ? ?07/10/2021 ? ?Reason for Visit: Postoperative visit, treatment discussion ? ?Treatment History: ?06/14/2021: Robotic total hysterectomy, BSO, bilateral sentinel lymph node biopsy, cystoscopy in the setting of CAH. ?Patient represented the night of postoperative day #0 after a near syncopal episode.  She is found to have acute anemia secondary to blood loss.  Although this stabilized, due to degree of blood loss suspected and no urine output during initial hours of her hospitalization, I recommended proceeding to the operating room. ?06/15/2021: Diagnostic laparoscopy, abdominal washout.  Findings at surgery included no ongoing/active bleeding. ? ?Interval History: ?She reports overall doing well.  She has some ongoing fatigue but this is slowly improving.  She denies any vaginal bleeding or discharge.  Reports regular bowel and bladder function.  Endorses a good appetite without nausea or emesis.  Denies any significant abdominal or pelvic pain. ? ?Past Medical/Surgical History: ?Past Medical History:  ?Diagnosis Date  ? Chronic cough   ? due to postnasal drainage  ? Chronic fatigue   ? Complex atypical endometrial hyperplasia   ? Fibromyalgia   ? Hyperlipidemia   ? LDL goal = < 120, ideally < 90  ? hypothyroidism   ? IBS (irritable bowel syndrome)   ? intermittent symptoms  ? ? ?Past Surgical History:  ?Procedure Laterality Date  ? APPENDECTOMY    ? infected, age 58  ? COLONOSCOPY  08/30/2011  ? Dr Sammuel Cooper  ? DILATION AND CURETTAGE OF UTERUS N/A 03/2021  ? LAPAROSCOPY N/A 06/15/2021  ? Procedure: LAPAROSCOPY DIAGNOSTIC WITH WASHOUT;  Surgeon: Lafonda Mosses, MD;  Location: [facility];  Service: Gynecology;  Laterality: N/A;  ? ROBOTIC ASSISTED TOTAL HYSTERECTOMY WITH BILATERAL SALPINGO OOPHERECTOMY N/A 06/14/2021  ? Procedure: XI ROBOTIC ASSISTED TOTAL HYSTERECTOMY WITH BILATERAL SALPINGO OOPHORECTOMY; CYSTOSCOPY;  Surgeon: Lafonda Mosses, MD;  Location: [facility];   Service: Gynecology;  Laterality: N/A;  ? SENTINEL NODE BIOPSY N/A 06/14/2021  ? Procedure: SENTINEL NODE BIOPSY;  Surgeon: Lafonda Mosses, MD;  Location: [facility];  Service: Gynecology;  Laterality: N/A;  ? TEMPOROMANDIBULAR JOINT SURGERY N/A   ? thyroglossal duct cystectomy    ? TONSILLECTOMY    ? WISDOM TOOTH EXTRACTION    ? ? ?Family History  ?Problem Relation Age of Onset  ? Cancer Mother   ?     colon, pancreatic,  ? Colon cancer Mother 67  ? Pancreatic cancer Mother   ? Cancer Father   ?     lung, smoker  ? Hypertension Brother   ? Hyperlipidemia Brother   ? Colon polyps Brother   ? Lupus Paternal Aunt   ? Arthritis Paternal Aunt   ?      RA in 2; 4 aunts with arthritis  ? Cancer Paternal Aunt   ?     uterine  ? Heart disease Maternal Grandfather   ?     MI @ 36  ? Cancer Maternal Grandfather   ?     pancreatic  ? Pancreatic cancer Maternal Grandfather   ? Hyperlipidemia Paternal Grandmother   ? Stroke Paternal Grandmother   ? Tourette syndrome Son   ? Colon cancer Maternal Great-grandfather   ? Esophageal cancer Neg Hx   ? Rectal cancer Neg Hx   ? Stomach cancer Neg Hx   ? Breast cancer Neg Hx   ? Ovarian cancer Neg Hx   ? Endometrial cancer Neg Hx   ? Prostate cancer Neg Hx   ? ? ?Social History  ? ?  Socioeconomic History  ? Marital status: Married  ?  Spouse name: Not on file  ? Number of children: Not on file  ? Years of education: Not on file  ? Highest education level: Not on file  ?Occupational History  ? Occupation: retired  ?Tobacco Use  ? Smoking status: Former  ?  Types: Cigarettes  ?  Quit date: 04/02/1998  ?  Years since quitting: 23.2  ? Smokeless tobacco: Never  ? Tobacco comments:  ?  quit 2000  ?Vaping Use  ? Vaping Use: Never used  ?Substance and Sexual Activity  ? Alcohol use: Yes  ?  Alcohol/week: 14.0 standard drinks  ?  Types: 14 Glasses of wine per week  ? Drug use: No  ? Sexual activity: Not Currently  ?Other Topics Concern  ? Not on file  ?Social History Narrative  ? Gets reg exercise   ? ?Social Determinants of Health  ? ?Financial Resource Strain: Not on file  ?Food Insecurity: Not on file  ?Transportation Needs: Not on file  ?Physical Activity: Not on file  ?Stress: Not on file  ?Social Connections: Not on file  ? ? ?Current Medications: ? ?Current Outpatient Medications:  ?  ARMOUR THYROID 90 MG tablet, Take 90 mg by mouth in the morning., Disp: , Rfl:  ?  cromolyn (NASALCROM) 5.2 MG/ACT nasal spray, Place 1 spray into both nostrils daily., Disp: , Rfl:  ?  ferrous sulfate 325 (65 FE) MG EC tablet, Take 1 tablet (325 mg total) by mouth daily with breakfast., Disp: 90 tablet, Rfl: 0 ?  fluocinonide cream (LIDEX) 2.70 %, Apply 1 application. topically 4 (four) times a week., Disp: , Rfl:  ?  MAGNESIUM PO, Take 1 tablet by mouth 2 (two) times a week., Disp: , Rfl:  ?  Polyethyl Glycol-Propyl Glycol (SYSTANE OP), Place 1 drop into both eyes daily as needed (for dry eyes)., Disp: , Rfl:  ?  Psyllium (METAMUCIL PO), Take 1 Scoop by mouth every other day., Disp: , Rfl:  ?  traZODone (DESYREL) 100 MG tablet, Take 100 mg by mouth at bedtime.  , Disp: , Rfl:  ?  VITAMIN D PO, Take 1 tablet by mouth 2 (two) times a week., Disp: , Rfl:  ?  zolpidem (AMBIEN) 5 MG tablet, Take 5 mg by mouth at bedtime as needed for sleep., Disp: , Rfl:  ? ?Review of Systems: ?+ fatigue, hot flashes ?Denies appetite changes, fevers, chills, unexplained weight changes. ?Denies hearing loss, neck lumps or masses, mouth sores, ringing in ears or voice changes. ?Denies cough or wheezing.  Denies shortness of breath. ?Denies chest pain or palpitations. Denies leg swelling. ?Denies abdominal distention, pain, blood in stools, constipation, diarrhea, nausea, vomiting, or early satiety. ?Denies pain with intercourse, dysuria, frequency, hematuria or incontinence. ?Denies pelvic pain, vaginal bleeding or vaginal discharge.   ?Denies joint pain, back pain or muscle pain/cramps. ?Denies itching, rash, or wounds. ?Denies dizziness,  headaches, numbness or seizures. ?Denies swollen lymph nodes or glands, denies easy bruising or bleeding. ?Denies anxiety, depression, confusion, or decreased concentration. ? ?Physical Exam: ?BP 136/66 (BP Location: Left Arm, Patient Position: Sitting)   Pulse 74   Temp 97.8 ?F (36.6 ?C) (Oral)   Resp 18   Ht 5' 6.54" (1.69 m)   Wt 154 lb (69.9 kg)   SpO2 100%   BMI 24.46 kg/m?  ?General: Alert, oriented, no acute distress. ?HEENT: Normocephalic, atraumatic, sclera anicteric. ?Chest: Unlabored breathing on room air. ?Abdomen: soft, nontender, mildly  distended in lower abdomen.  Normoactive bowel sounds.  No masses or hepatosplenomegaly appreciated.  Well-healed incisions, remaining Dermabond removed.  Scant peri-incisional bruising. ?Extremities: Grossly normal range of motion.  Warm, well perfused.  No edema bilaterally. ?Skin: No rashes or lesions noted. ?GU: Normal appearing external genitalia without erythema, excoriation, or lesions.  Speculum exam reveals cuff intact, suture still visible.  Bimanual exam reveals no fluctuance or tenderness with palpation of the cuff.  Patient does have some tenderness with the speculum exam itself that seems more external within the vagina.   ? ?Laboratory & Radiologic Studies: ?A.   LYMPH NODE, SENTINEL, RIGHT EXTERNAL ILIAC, BIOPSY:  ?-    Lymph node, negative for malignancy (0/1).  ? ?B.   LYMPH NODE, RIGHT EXTERNAL ILIAC, BIOPSY:  ?-    Lymph node, negative for malignancy (0/1).  ? ?C.   LYMPH NODE, SENTINEL, LEFT EXTERNAL ILIAC, BIOPSY:  ?-    Fatty tissue.  ?-    No lymph node tissue identified.  ? ?D.   LYMPH NODE, SENTINEL, LEFT OBTURATOR, BIOPSY:  ?-    Lymph node, negative for malignancy (0/1).  ? ?E.   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  ? ?-    Cervix:             -Negative for dysplasia.  ?-    Myometrium:          -Adenomyosis.  ?-Leiomyomas, three, largest 1 cm in greatest dimension.  ?      -   Right ovary:        -Serous cystadenoma.  ?      -   Left ovary:         -Senescent.  ?      -   Right fallopian tube:    -Cys

## 2021-07-10 NOTE — Progress Notes (Signed)
Patient came in for a follow up with Dr. Berline Lopes.  Patient stated "I would like to cancel my genetic testing for right now.  I have been in the hospital for 5 days and I have been poked on too much lately.  I want to think about it and get back to Dr. Berline Lopes."  ?

## 2021-07-10 NOTE — Patient Instructions (Signed)
It was great to see you.  You are healing well from surgery.  Remember, no heavy lifting for 6 weeks and nothing in the vagina for at least 8 weeks. ? ?We will have a phone call to check-in 1 more time in about a month.  As long as everything is continuing to go well, I will release you back to your other doctors at that point. ?

## 2021-07-17 ENCOUNTER — Other Ambulatory Visit: Payer: Medicare HMO

## 2021-07-17 ENCOUNTER — Encounter: Payer: Medicare HMO | Admitting: Licensed Clinical Social Worker

## 2021-07-31 ENCOUNTER — Telehealth: Payer: Self-pay | Admitting: *Deleted

## 2021-07-31 NOTE — Telephone Encounter (Signed)
Spoke with pt today to reschedule her phone visit. Pt also stated that she stopped her estrogen patch for the surgery and was wondering if she can put them back on because her hot flashes have returned. Informed her that we would notify the doctors and let her know.  ?Joylene John, NP notified.   ?

## 2021-08-02 NOTE — Telephone Encounter (Signed)
I spoke to pt today regarding a message she left on Monday. Per Dr. Berline Lopes, she may resume the Estrogen patch. Pt voiced an understanding and was thankful for the call.  ?KJ CMA ?

## 2021-08-08 ENCOUNTER — Telehealth: Payer: Medicare HMO | Admitting: Gynecologic Oncology

## 2021-08-09 ENCOUNTER — Inpatient Hospital Stay: Payer: Medicare HMO | Attending: Gynecologic Oncology | Admitting: Gynecologic Oncology

## 2021-08-09 DIAGNOSIS — Z90710 Acquired absence of both cervix and uterus: Secondary | ICD-10-CM

## 2021-08-09 DIAGNOSIS — N8502 Endometrial intraepithelial neoplasia [EIN]: Secondary | ICD-10-CM

## 2021-08-09 DIAGNOSIS — Z90722 Acquired absence of ovaries, bilateral: Secondary | ICD-10-CM

## 2021-08-09 DIAGNOSIS — Z800 Family history of malignant neoplasm of digestive organs: Secondary | ICD-10-CM

## 2021-08-09 NOTE — Progress Notes (Signed)
Gynecologic Oncology Telehealth Consult Note: Gyn-Onc ? ?I connected with Helen Gardner on 08/10/21 at  4:20 PM EDT by telephone and verified that I am speaking with the correct person using two identifiers. ? ?I discussed the limitations, risks, security and privacy concerns of performing an evaluation and management service by telemedicine and the availability of in-person appointments. I also discussed with the patient that there may be a patient responsible charge related to this service. The patient expressed understanding and agreed to proceed. ? ?Other persons participating in the visit and their role in the encounter: None. ? ?Patient's location: Home ?Provider's location: [facility] ? ?Reason for Visit: follow-up after surgery ? ?Treatment History: ?06/14/2021: Robotic total hysterectomy, BSO, bilateral sentinel lymph node biopsy, cystoscopy in the setting of CAH. ?Patient represented the night of postoperative day #0 after a near syncopal episode.  She is found to have acute anemia secondary to blood loss.  Although this stabilized, due to degree of blood loss suspected and no urine output during initial hours of her hospitalization, I recommended proceeding to the operating room. ?06/15/2021: Diagnostic laparoscopy, abdominal washout.  Findings at surgery included no ongoing/active bleeding. ? ?Interval History: ?Doing well. ?Denies pain. Bowels and bladder working well. ?Denies vaginal or discharge. ?Still some fatigue, but energy improving.  ?Back on estrogen patch, sleep much improved. ? ?Past Medical/Surgical History: ?Past Medical History:  ?Diagnosis Date  ? Chronic cough   ? due to postnasal drainage  ? Chronic fatigue   ? Complex atypical endometrial hyperplasia   ? Fibromyalgia   ? Hyperlipidemia   ? LDL goal = < 120, ideally < 90  ? hypothyroidism   ? IBS (irritable bowel syndrome)   ? intermittent symptoms  ? ? ?Past Surgical History:  ?Procedure Laterality Date  ? APPENDECTOMY    ?  infected, age 62  ? COLONOSCOPY  08/30/2011  ? Dr Sammuel Cooper  ? DILATION AND CURETTAGE OF UTERUS N/A 03/2021  ? LAPAROSCOPY N/A 06/15/2021  ? Procedure: LAPAROSCOPY DIAGNOSTIC WITH WASHOUT;  Surgeon: Lafonda Mosses, MD;  Location: [facility];  Service: Gynecology;  Laterality: N/A;  ? ROBOTIC ASSISTED TOTAL HYSTERECTOMY WITH BILATERAL SALPINGO OOPHERECTOMY N/A 06/14/2021  ? Procedure: XI ROBOTIC ASSISTED TOTAL HYSTERECTOMY WITH BILATERAL SALPINGO OOPHORECTOMY; CYSTOSCOPY;  Surgeon: Lafonda Mosses, MD;  Location: [facility];  Service: Gynecology;  Laterality: N/A;  ? SENTINEL NODE BIOPSY N/A 06/14/2021  ? Procedure: SENTINEL NODE BIOPSY;  Surgeon: Lafonda Mosses, MD;  Location: [facility];  Service: Gynecology;  Laterality: N/A;  ? TEMPOROMANDIBULAR JOINT SURGERY N/A   ? thyroglossal duct cystectomy    ? TONSILLECTOMY    ? WISDOM TOOTH EXTRACTION    ? ? ?Family History  ?Problem Relation Age of Onset  ? Cancer Mother   ?     colon, pancreatic,  ? Colon cancer Mother 60  ? Pancreatic cancer Mother   ? Cancer Father   ?     lung, smoker  ? Hypertension Brother   ? Hyperlipidemia Brother   ? Colon polyps Brother   ? Lupus Paternal Aunt   ? Arthritis Paternal Aunt   ?      RA in 2; 4 aunts with arthritis  ? Cancer Paternal Aunt   ?     uterine  ? Heart disease Maternal Grandfather   ?     MI @ 70  ? Cancer Maternal Grandfather   ?     pancreatic  ? Pancreatic cancer Maternal Grandfather   ?  Hyperlipidemia Paternal Grandmother   ? Stroke Paternal Grandmother   ? Tourette syndrome Son   ? Colon cancer Maternal Great-grandfather   ? Esophageal cancer Neg Hx   ? Rectal cancer Neg Hx   ? Stomach cancer Neg Hx   ? Breast cancer Neg Hx   ? Ovarian cancer Neg Hx   ? Endometrial cancer Neg Hx   ? Prostate cancer Neg Hx   ? ? ?Social History  ? ?Socioeconomic History  ? Marital status: Married  ?  Spouse name: Not on file  ? Number of children: Not on file  ? Years of education: Not on file  ? Highest education level: Not on file   ?Occupational History  ? Occupation: retired  ?Tobacco Use  ? Smoking status: Former  ?  Types: Cigarettes  ?  Quit date: 04/02/1998  ?  Years since quitting: 23.3  ? Smokeless tobacco: Never  ? Tobacco comments:  ?  quit 2000  ?Vaping Use  ? Vaping Use: Never used  ?Substance and Sexual Activity  ? Alcohol use: Yes  ?  Alcohol/week: 14.0 standard drinks  ?  Types: 14 Glasses of wine per week  ? Drug use: No  ? Sexual activity: Not Currently  ?Other Topics Concern  ? Not on file  ?Social History Narrative  ? Gets reg exercise  ? ?Social Determinants of Health  ? ?Financial Resource Strain: Not on file  ?Food Insecurity: Not on file  ?Transportation Needs: Not on file  ?Physical Activity: Not on file  ?Stress: Not on file  ?Social Connections: Not on file  ? ? ?Current Medications: ? ?Current Outpatient Medications:  ?  ARMOUR THYROID 90 MG tablet, Take 90 mg by mouth in the morning., Disp: , Rfl:  ?  cromolyn (NASALCROM) 5.2 MG/ACT nasal spray, Place 1 spray into both nostrils daily., Disp: , Rfl:  ?  ferrous sulfate 325 (65 FE) MG EC tablet, Take 1 tablet (325 mg total) by mouth daily with breakfast., Disp: 90 tablet, Rfl: 0 ?  fluocinonide cream (LIDEX) 0.62 %, Apply 1 application. topically 4 (four) times a week., Disp: , Rfl:  ?  MAGNESIUM PO, Take 1 tablet by mouth 2 (two) times a week., Disp: , Rfl:  ?  Polyethyl Glycol-Propyl Glycol (SYSTANE OP), Place 1 drop into both eyes daily as needed (for dry eyes)., Disp: , Rfl:  ?  Psyllium (METAMUCIL PO), Take 1 Scoop by mouth every other day., Disp: , Rfl:  ?  traZODone (DESYREL) 100 MG tablet, Take 100 mg by mouth at bedtime.  , Disp: , Rfl:  ?  VITAMIN D PO, Take 1 tablet by mouth 2 (two) times a week., Disp: , Rfl:  ?  zolpidem (AMBIEN) 5 MG tablet, Take 5 mg by mouth at bedtime as needed for sleep., Disp: , Rfl:  ? ?Review of Symptoms: ?Pertinent positives as per HPI. ? ?Physical Exam: ?There were no vitals taken for this visit. ?Deferred given limitations of  phone visit. ? ?Laboratory & Radiologic Studies: ?None new ? ?Assessment & Plan: ?Helen Gardner is a 76 y.o. woman who is status post definitive surgery for CAH in mid March with postoperative course complicated by acute intra-abdominal bleed requiring repeat surgery who presents today for postoperative follow-up. ?  ?Patient continues to do well and she is improving with time.  She has restarted her HRT with significant improvement in her sleep. ?  ?As she is doing well, I will release her to her other  physicians for continued care.  She would benefit from a yearly visit with her OBGYN or another provider who can perform a pelvic exam.  ? ?I discussed the assessment and treatment plan with the patient. The patient was provided with an opportunity to ask questions and all were answered. The patient agreed with the plan and demonstrated an understanding of the instructions.  ? ?The patient was advised to call back or see an in-person evaluation if the symptoms worsen or if the condition fails to improve as anticipated.  ? ?8 minutes of total time was spent for this patient encounter, including preparation, phone counseling with the patient and coordination of care, and documentation of the encounter. ? ? ?Jeral Pinch, MD  ?Division of Gynecologic Oncology  ?Department of Obstetrics and Gynecology  ?[facility]  ? ?

## 2021-08-10 ENCOUNTER — Encounter: Payer: Self-pay | Admitting: Gynecologic Oncology

## 2021-08-18 ENCOUNTER — Telehealth: Payer: Self-pay | Admitting: Gynecologic Oncology

## 2021-08-18 NOTE — Telephone Encounter (Signed)
.  Called patient to schedule appointment per 5/19 inbasket, patient is aware of date and time.   

## 2021-09-25 ENCOUNTER — Other Ambulatory Visit: Payer: Medicare HMO

## 2021-09-25 ENCOUNTER — Encounter: Payer: Medicare HMO | Admitting: Licensed Clinical Social Worker

## 2021-10-12 ENCOUNTER — Other Ambulatory Visit: Payer: Self-pay | Admitting: Internal Medicine

## 2021-10-12 DIAGNOSIS — E785 Hyperlipidemia, unspecified: Secondary | ICD-10-CM

## 2021-10-30 ENCOUNTER — Other Ambulatory Visit: Payer: Self-pay | Admitting: Obstetrics and Gynecology

## 2021-10-30 DIAGNOSIS — Z1231 Encounter for screening mammogram for malignant neoplasm of breast: Secondary | ICD-10-CM

## 2021-11-01 DIAGNOSIS — M25562 Pain in left knee: Secondary | ICD-10-CM | POA: Diagnosis not present

## 2021-11-01 DIAGNOSIS — M545 Low back pain, unspecified: Secondary | ICD-10-CM | POA: Diagnosis not present

## 2021-11-01 DIAGNOSIS — M1990 Unspecified osteoarthritis, unspecified site: Secondary | ICD-10-CM | POA: Diagnosis not present

## 2021-11-01 DIAGNOSIS — M797 Fibromyalgia: Secondary | ICD-10-CM | POA: Diagnosis not present

## 2021-11-01 DIAGNOSIS — M25552 Pain in left hip: Secondary | ICD-10-CM | POA: Diagnosis not present

## 2021-11-23 DIAGNOSIS — M25552 Pain in left hip: Secondary | ICD-10-CM | POA: Diagnosis not present

## 2021-11-23 DIAGNOSIS — M25562 Pain in left knee: Secondary | ICD-10-CM | POA: Diagnosis not present

## 2021-11-29 DIAGNOSIS — D225 Melanocytic nevi of trunk: Secondary | ICD-10-CM | POA: Diagnosis not present

## 2021-11-29 DIAGNOSIS — L218 Other seborrheic dermatitis: Secondary | ICD-10-CM | POA: Diagnosis not present

## 2021-11-29 DIAGNOSIS — L720 Epidermal cyst: Secondary | ICD-10-CM | POA: Diagnosis not present

## 2021-11-29 DIAGNOSIS — L718 Other rosacea: Secondary | ICD-10-CM | POA: Diagnosis not present

## 2021-11-29 DIAGNOSIS — L821 Other seborrheic keratosis: Secondary | ICD-10-CM | POA: Diagnosis not present

## 2021-12-08 DIAGNOSIS — N959 Unspecified menopausal and perimenopausal disorder: Secondary | ICD-10-CM | POA: Diagnosis not present

## 2021-12-08 DIAGNOSIS — N952 Postmenopausal atrophic vaginitis: Secondary | ICD-10-CM | POA: Diagnosis not present

## 2021-12-08 DIAGNOSIS — Z6825 Body mass index (BMI) 25.0-25.9, adult: Secondary | ICD-10-CM | POA: Diagnosis not present

## 2021-12-08 DIAGNOSIS — Z01419 Encounter for gynecological examination (general) (routine) without abnormal findings: Secondary | ICD-10-CM | POA: Diagnosis not present

## 2021-12-11 ENCOUNTER — Ambulatory Visit
Admission: RE | Admit: 2021-12-11 | Discharge: 2021-12-11 | Disposition: A | Discharge status: Home / Self Care | Payer: Medicare HMO | Source: Ambulatory Visit | Attending: Obstetrics and Gynecology | Admitting: Obstetrics and Gynecology

## 2021-12-11 DIAGNOSIS — Z1231 Encounter for screening mammogram for malignant neoplasm of breast: Secondary | ICD-10-CM | POA: Diagnosis not present

## 2022-01-04 ENCOUNTER — Ambulatory Visit
Admission: RE | Admit: 2022-01-04 | Discharge: 2022-01-04 | Disposition: A | Discharge status: Home / Self Care | Payer: No Typology Code available for payment source | Source: Ambulatory Visit | Attending: Internal Medicine | Admitting: Internal Medicine

## 2022-01-04 ENCOUNTER — Other Ambulatory Visit: Payer: Self-pay

## 2022-01-04 DIAGNOSIS — E785 Hyperlipidemia, unspecified: Secondary | ICD-10-CM

## 2022-01-17 ENCOUNTER — Other Ambulatory Visit: Payer: Medicare HMO

## 2022-01-19 DIAGNOSIS — M25562 Pain in left knee: Secondary | ICD-10-CM | POA: Diagnosis not present

## 2022-01-19 DIAGNOSIS — M1612 Unilateral primary osteoarthritis, left hip: Secondary | ICD-10-CM | POA: Diagnosis not present

## 2022-01-19 DIAGNOSIS — M25552 Pain in left hip: Secondary | ICD-10-CM | POA: Diagnosis not present

## 2022-01-19 DIAGNOSIS — M25561 Pain in right knee: Secondary | ICD-10-CM | POA: Diagnosis not present

## 2022-03-07 ENCOUNTER — Ambulatory Visit: Payer: Medicare HMO | Admitting: Nurse Practitioner

## 2022-03-07 DIAGNOSIS — E039 Hypothyroidism, unspecified: Secondary | ICD-10-CM | POA: Diagnosis not present

## 2022-03-07 DIAGNOSIS — Z862 Personal history of diseases of the blood and blood-forming organs and certain disorders involving the immune mechanism: Secondary | ICD-10-CM | POA: Diagnosis not present

## 2022-03-07 DIAGNOSIS — M7130 Other bursal cyst, unspecified site: Secondary | ICD-10-CM | POA: Diagnosis not present

## 2022-03-07 DIAGNOSIS — Z01818 Encounter for other preprocedural examination: Secondary | ICD-10-CM | POA: Diagnosis not present

## 2022-03-07 DIAGNOSIS — J3489 Other specified disorders of nose and nasal sinuses: Secondary | ICD-10-CM | POA: Diagnosis not present

## 2022-03-07 DIAGNOSIS — E785 Hyperlipidemia, unspecified: Secondary | ICD-10-CM | POA: Diagnosis not present

## 2022-03-07 DIAGNOSIS — I2584 Coronary atherosclerosis due to calcified coronary lesion: Secondary | ICD-10-CM | POA: Diagnosis not present

## 2022-03-07 DIAGNOSIS — M25552 Pain in left hip: Secondary | ICD-10-CM | POA: Diagnosis not present

## 2022-03-09 ENCOUNTER — Other Ambulatory Visit: Payer: Self-pay | Admitting: Internal Medicine

## 2022-03-09 DIAGNOSIS — J3489 Other specified disorders of nose and nasal sinuses: Secondary | ICD-10-CM

## 2022-04-04 NOTE — Patient Instructions (Addendum)
SURGICAL WAITING ROOM VISITATION  Patients having surgery or a procedure may have no more than 2 support people in the waiting area - these visitors may rotate.    Children under the age of 24 must have an adult with them who is not the patient.  Due to an increase in RSV and influenza rates and associated hospitalizations, children ages 44 and under may not visit patients in [place].  If the patient needs to stay at the hospital during part of their recovery, the visitor guidelines for inpatient rooms apply. Pre-op nurse will coordinate an appropriate time for 1 support person to accompany patient in pre-op.  This support person may not rotate.    Please refer to the [facility] website for the visitor guidelines for Inpatients (after your surgery is over and you are in a regular room).       Your procedure is scheduled on:  04/17/2022    Report to [facility] Main Entrance    Report to admitting at   11:50 AM   Call this number if you have problems the morning of surgery [phone]   Do not eat food :After Midnight.   After Midnight you may have the following liquids until 11:15 AM DAY OF SURGERY  Water Non-Citrus Juices (without pulp, NO RED-Apple, White grape, White cranberry) Black Coffee (NO MILK/CREAM OR CREAMERS, sugar ok)  Clear Tea (NO MILK/CREAM OR CREAMERS, sugar ok) regular and decaf                             Plain Jell-O (NO RED)                                           Fruit ices (not with fruit pulp, NO RED)                                     Popsicles (NO RED)                                                               Sports drinks like Gatorade (NO RED)                    The day of surgery:  Drink ONE (1) Pre-Surgery Clear Ensure at  11:15 AM ( have completed by )  the morning of surgery. Drink in one sitting. Do not sip.  This drink was given to you during your hospital  pre-op appointment visit. Nothing else to drink after  completing the Pre-Surgery Clear Ensure .          If you have questions, please contact your surgeon's office.    Oral Hygiene is also important to reduce your risk of infection.                                    Remember - BRUSH YOUR TEETH THE MORNING OF SURGERY WITH YOUR REGULAR TOOTHPASTE  DENTURES WILL BE  REMOVED PRIOR TO SURGERY PLEASE DO NOT APPLY "Poly grip" OR ADHESIVES!!!   Do NOT smoke after Midnight   Take these medicines the morning of surgery with A SIP OF WATER:  Armour Thyroid                               You may not have any metal on your body including hair pins, jewelry, and body piercing             Do not wear make-up, lotions, powders, perfumes, or deodorant  Do not wear nail polish including gel and S&S, artificial/acrylic nails, or any other type of covering on natural nails including finger and toenails. If you have artificial nails, gel coating, etc. that needs to be removed by a nail salon please have this removed prior to surgery or surgery may need to be canceled/ delayed if the surgeon/ anesthesia feels like they are unable to be safely monitored.   Do not shave  48 hours prior to surgery.    Do not bring valuables to the hospital. [name]   Contacts, glasses, dentures or bridgework may not be worn into surgery.   Bring small overnight bag day of surgery.   DO NOT [name] PHARMACY WILL DISPENSE MEDICATIONS LISTED ON YOUR MEDICATION LIST TO YOU DURING YOUR ADMISSION [name]    Patients discharged on the day of surgery will not be allowed to drive home.  Someone NEEDS to stay with you for the first 24 hours after anesthesia.               Please read over the following fact sheets you were given: IF Helen Gardner Helen Gardner  If you received a COVID test during your pre-op visit  it is requested that you wear a  mask when out in public, stay away from anyone that may not be feeling well and notify your surgeon if you develop symptoms. If you test positive for Covid or have been in contact with anyone that has tested positive in the last 10 days please notify you surgeon.    Helen Gardner - Preparing for Surgery Before surgery, you can play an important role.  Because skin is not sterile, your skin needs to be as free of germs as possible.  You can reduce the number of germs on your skin by washing with CHG (chlorahexidine gluconate) soap before surgery.  CHG is an antiseptic cleaner which kills germs and bonds with the skin to continue killing germs even after washing. Please DO NOT use if you have an allergy to CHG or antibacterial soaps.  If your skin becomes reddened/irritated stop using the CHG and inform your nurse when you arrive at Short Stay. Do not shave (including legs and underarms) for at least 48 hours prior to the first CHG shower.  You may shave your face/neck. Please follow these instructions carefully:  1.  Shower with CHG Soap the night before surgery and the  morning of Surgery.  2.  If you choose to wash your hair, wash your hair first as usual with your  normal  shampoo.  3.  After you shampoo, rinse your hair and body thoroughly to remove the  shampoo.  4.  Use CHG as you would any other liquid soap.  You can apply chg directly  to the skin and wash                       Gently with a scrungie or clean washcloth.  5.  Apply the CHG Soap to your body ONLY FROM THE NECK DOWN.   Do not use on face/ open                           Wound or open sores. Avoid contact with eyes, ears mouth and genitals (private parts).                       Wash face,  Genitals (private parts) with your normal soap.             6.  Wash thoroughly, paying special attention to the area where your surgery  will be performed.  7.  Thoroughly rinse your body with warm water from the neck  down.  8.  DO NOT shower/wash with your normal soap after using and rinsing off  the CHG Soap.                9.  Pat yourself dry with a clean towel.            10.  Wear clean pajamas.            11.  Place clean sheets on your bed the night of your first shower and do not  sleep with pets. Day of Surgery : Do not apply any lotions/deodorants the morning of surgery.  Please wear clean clothes to the hospital/surgery center.  FAILURE TO FOLLOW THESE INSTRUCTIONS MAY RESULT IN THE CANCELLATION OF YOUR SURGERY PATIENT SIGNATURE_________________________________  NURSE SIGNATURE__________________________________  ________________________________________________________________________    Helen Gardner  An incentive spirometer is a tool that can help keep your lungs clear and active. This tool measures how well you are filling your lungs with each breath. Taking long deep breaths may help reverse or decrease the chance of developing breathing (pulmonary) problems (especially infection) following: A long period of time when you are unable to move or be active. BEFORE THE PROCEDURE  If the spirometer includes an indicator to show your best effort, your nurse or respiratory therapist will set it to a desired goal. If possible, sit up straight or lean slightly forward. Try not to slouch. Hold the incentive spirometer in an upright position. INSTRUCTIONS FOR USE  Sit on the edge of your bed if possible, or sit up as far as you can in bed or on a chair. Hold the incentive spirometer in an upright position. Breathe out normally. Place the mouthpiece in your mouth and seal your lips tightly around it. Breathe in slowly and as deeply as possible, raising the piston or the ball toward the top of the column. Hold your breath for 3-5 seconds or for as long as possible. Allow the piston or ball to fall to the bottom of the column. Remove the mouthpiece from your mouth and breathe out  normally. Rest for a few seconds and repeat Steps 1 through 7 at least 10 times every 1-2 hours when you are awake. Take your time and take a few normal breaths between deep breaths. The spirometer may include an indicator to show your best effort. Use the indicator as a goal to work  toward during each repetition. After each set of 10 deep breaths, practice coughing to be sure your lungs are clear. If you have an incision (the cut made at the time of surgery), support your incision when coughing by placing a pillow or rolled up towels firmly against it. Once you are able to get out of bed, walk around indoors and cough well. You may stop using the incentive spirometer when instructed by your caregiver.  RISKS AND COMPLICATIONS Take your time so you do not get dizzy or light-headed. If you are in pain, you may need to take or ask for pain medication before doing incentive spirometry. It is harder to take a deep breath if you are having pain. AFTER USE Rest and breathe slowly and easily. It can be helpful to keep track of a log of your progress. Your caregiver can provide you with a simple table to help with this. If you are using the spirometer at home, follow these instructions: [place] IF:  You are having difficultly using the spirometer. You have trouble using the spirometer as often as instructed. Your pain medication is not giving enough relief while using the spirometer. You develop fever of 100.5 F (38.1 C) or higher. SEEK IMMEDIATE MEDICAL CARE IF:  You cough up bloody sputum that had not been present before. You develop fever of 102 F (38.9 C) or greater. You develop worsening pain at or near the incision site. MAKE SURE YOU:  Understand these instructions. Will watch your condition. Will get help right away if you are not doing well or get worse. Document Released: 07/30/2006 Document Revised: 06/11/2011 Document Reviewed: 09/30/2006 ExitCare Patient Information  2014 ExitCare, [place].   ________________________________________________________________________ WHAT IS A BLOOD TRANSFUSION? Blood Transfusion Information  A transfusion is the replacement of blood or some of its parts. Blood is made up of multiple cells which provide different functions. Red blood cells carry oxygen and are used for blood loss replacement. White blood cells fight against infection. Platelets control bleeding. Plasma helps clot blood. Other blood products are available for specialized needs, such as hemophilia or other clotting disorders. BEFORE THE TRANSFUSION  Who gives blood for transfusions?  Healthy volunteers who are fully evaluated to make sure their blood is safe. This is blood bank blood. Transfusion therapy is the safest it has ever been in the practice of medicine. Before blood is taken from a donor, a complete history is taken to make sure that person has no history of diseases nor engages in risky social behavior (examples are intravenous drug use or sexual activity with multiple partners). The donor's travel history is screened to minimize risk of transmitting infections, such as malaria. The donated blood is tested for signs of infectious diseases, such as HIV and hepatitis. The blood is then tested to be sure it is compatible with you in order to minimize the chance of a transfusion reaction. If you or a relative donates blood, this is often done in anticipation of surgery and is not appropriate for emergency situations. It takes many days to process the donated blood. RISKS AND COMPLICATIONS Although transfusion therapy is very safe and saves many lives, the main dangers of transfusion include:  Getting an infectious disease. Developing a transfusion reaction. This is an allergic reaction to something in the blood you were given. Every precaution is taken to prevent this. The decision to have a blood transfusion has been considered carefully by your caregiver  before blood is given. Blood is not  given unless the benefits outweigh the risks. AFTER THE TRANSFUSION Right after receiving a blood transfusion, you will usually feel much better and more energetic. This is especially true if your red blood cells have gotten low (anemic). The transfusion raises the level of the red blood cells which carry oxygen, and this usually causes an energy increase. The nurse administering the transfusion will monitor you carefully for complications. HOME CARE INSTRUCTIONS  No special instructions are needed after a transfusion. You may find your energy is better. Speak with your caregiver about any limitations on activity for underlying diseases you may have. SEEK MEDICAL CARE IF:  Your condition is not improving after your transfusion. You develop redness or irritation at the intravenous (IV) site. SEEK IMMEDIATE MEDICAL CARE IF:  Any of the following symptoms occur over the next 12 hours: Shaking chills. You have a temperature by mouth above 102 F (38.9 C), not controlled by medicine. Chest, back, or muscle pain. People around you feel you are not acting correctly or are confused. Shortness of breath or difficulty breathing. Dizziness and fainting. You get a rash or develop hives. You have a decrease in urine output. Your urine turns a dark color or changes to pink, red, or brown. Any of the following symptoms occur over the next 10 days: You have a temperature by mouth above 102 F (38.9 C), not controlled by medicine. Shortness of breath. Weakness after normal activity. The white part of the eye turns yellow (jaundice). You have a decrease in the amount of urine or are urinating less often. Your urine turns a dark color or changes to pink, red, or brown. Document Released: 03/16/2000 Document Revised: 06/11/2011 Document Reviewed: 11/03/2007 [facility] Patient Information 2014 [place],  [place].  _______________________________________________________________________

## 2022-04-09 ENCOUNTER — Other Ambulatory Visit: Payer: Self-pay

## 2022-04-09 ENCOUNTER — Encounter (HOSPITAL_COMMUNITY)
Admission: RE | Admit: 2022-04-09 | Discharge: 2022-04-09 | Disposition: A | Discharge status: Home / Self Care | Payer: Medicare HMO | Source: Ambulatory Visit | Attending: Orthopedic Surgery | Admitting: Orthopedic Surgery

## 2022-04-09 ENCOUNTER — Encounter (HOSPITAL_COMMUNITY): Payer: Self-pay

## 2022-04-09 VITALS — BP 145/80 | HR 76 | Temp 97.8°F | Resp 12 | Ht 67.0 in | Wt 159.4 lb

## 2022-04-09 DIAGNOSIS — Z01812 Encounter for preprocedural laboratory examination: Secondary | ICD-10-CM | POA: Insufficient documentation

## 2022-04-09 DIAGNOSIS — Z01818 Encounter for other preprocedural examination: Secondary | ICD-10-CM

## 2022-04-09 HISTORY — DX: Hypothyroidism, unspecified: E03.9

## 2022-04-09 HISTORY — DX: Anemia, unspecified: D64.9

## 2022-04-09 HISTORY — DX: Unspecified osteoarthritis, unspecified site: M19.90

## 2022-04-09 HISTORY — DX: Migraine, unspecified, not intractable, without status migrainosus: G43.909

## 2022-04-09 LAB — CBC
HCT: 40.8 % (ref 36.0–46.0)
Hemoglobin: 13.2 g/dL (ref 12.0–15.0)
MCH: 29.7 pg (ref 26.0–34.0)
MCHC: 32.4 g/dL (ref 30.0–36.0)
MCV: 91.7 fL (ref 80.0–100.0)
Platelets: 257 10*3/uL (ref 150–400)
RBC: 4.45 MIL/uL (ref 3.87–5.11)
RDW: 13 % (ref 11.5–15.5)
WBC: 6.4 10*3/uL (ref 4.0–10.5)
nRBC: 0 % (ref 0.0–0.2)

## 2022-04-09 NOTE — Progress Notes (Addendum)
COVID Vaccine Completed:  Yes  Date of COVID positive in last 90 days:  No  PCP - Velna Hatchet, MD Cardiologist -   Chest x-ray - 06-14-21 Epic EKG - 06-15-21 Epic Stress Test - N/A ECHO - N/A Cardiac Cath - N/A Pacemaker/ICD device last checked: Spinal Cord Stimulator:N/A Cardiac CT - 08-29-18  Bowel Prep - N/A  Sleep Study - N/A CPAP -   Fasting Blood Sugar - N/A Checks Blood Sugar _____ times a day  Last dose of GLP1 agonist-  N/A GLP1 instructions:  N/A   Last dose of SGLT-2 inhibitors-  N/A SGLT-2 instructions: N/A  Blood Thinner Instructions:  N/A Aspirin Instructions: Last Dose:  Activity level:  Can go up a flight of stairs and perform activities of daily living without stopping and without symptoms of chest pain or shortness of breath.  Anesthesia review:  N/A Patient denies shortness of breath, fever, cough and chest pain at PAT appointment  Patient verbalized understanding of instructions that were given to them at the PAT appointment. Patient was also instructed that they will need to review over the PAT instructions again at home before surgery.

## 2022-04-10 LAB — SURGICAL PCR SCREEN
MRSA, PCR: NEGATIVE
Staphylococcus aureus: NEGATIVE

## 2022-04-11 ENCOUNTER — Ambulatory Visit
Admission: RE | Admit: 2022-04-11 | Discharge: 2022-04-11 | Disposition: A | Discharge status: Home / Self Care | Payer: Medicare HMO | Source: Ambulatory Visit | Attending: Internal Medicine | Admitting: Internal Medicine

## 2022-04-11 DIAGNOSIS — R519 Headache, unspecified: Secondary | ICD-10-CM | POA: Diagnosis not present

## 2022-04-11 DIAGNOSIS — J3489 Other specified disorders of nose and nasal sinuses: Secondary | ICD-10-CM

## 2022-04-14 NOTE — H&P (Signed)
TOTAL HIP ADMISSION H&P  Patient is admitted for left total hip arthroplasty.  Subjective:  Chief Complaint: left hip pain  HPI: Helen Gardner, 77 y.o. female, has a history of pain and functional disability in the left hip(s) due to arthritis and patient has failed non-surgical conservative treatments for greater than 12 weeks to include NSAID's and/or analgesics and activity modification.  Onset of symptoms was gradual starting 2 years ago with gradually worsening course since that time.The patient noted no past surgery on the left hip(s).  Patient currently rates pain in the left hip at 8 out of 10 with activity. Patient has worsening of pain with activity and weight bearing, pain that interfers with activities of daily living, and pain with passive range of motion. Patient has evidence of joint space narrowing by imaging studies. This condition presents safety issues increasing the risk of falls.   There is no current active infection.  Patient Active Problem List   Diagnosis Date Noted   Syncope 06/15/2021   Postoperative anemia due to acute blood loss 06/15/2021   Acute blood loss as cause of postoperative anemia    Complex endometrial hyperplasia with atypia    Alopecia 03/22/2011   Seborrheic dermatitis, unspecified 03/22/2011   ARTHRALGIA 11/11/2009   Lumbago 11/11/2009   HYPERLIPIDEMIA 08/12/2008   TINNITUS, CHRONIC, BILATERAL 08/12/2008   Chronic fatigue syndrome 08/28/2006   THYROID FUNCTION TEST, ABNORMAL 08/28/2006   Past Medical History:  Diagnosis Date   Anemia    Arthritis    Chronic cough    due to postnasal drainage   Chronic fatigue    Complex atypical endometrial hyperplasia    Fibromyalgia    Hyperlipidemia    LDL goal = < 120, ideally < 90   hypothyroidism    Hypothyroidism    IBS (irritable bowel syndrome)    intermittent symptoms   Migraine headache     Past Surgical History:  Procedure Laterality Date   APPENDECTOMY     infected, age 73    COLONOSCOPY  08/30/2011   Dr Sammuel Cooper   DILATION AND CURETTAGE OF UTERUS N/A 03/2021   LAPAROSCOPY N/A 06/15/2021   Procedure: LAPAROSCOPY DIAGNOSTIC WITH WASHOUT;  Surgeon: Lafonda Mosses, MD;  Location: [facility];  Service: Gynecology;  Laterality: N/A;   ROBOTIC ASSISTED TOTAL HYSTERECTOMY WITH BILATERAL SALPINGO OOPHERECTOMY N/A 06/14/2021   Procedure: XI ROBOTIC ASSISTED TOTAL HYSTERECTOMY WITH BILATERAL SALPINGO OOPHORECTOMY; CYSTOSCOPY;  Surgeon: Lafonda Mosses, MD;  Location: [facility];  Service: Gynecology;  Laterality: N/A;   SENTINEL NODE BIOPSY N/A 06/14/2021   Procedure: SENTINEL NODE BIOPSY;  Surgeon: Lafonda Mosses, MD;  Location: [facility];  Service: Gynecology;  Laterality: N/A;   TEMPOROMANDIBULAR JOINT SURGERY N/A    thyroglossal duct cystectomy     TONSILLECTOMY     WISDOM TOOTH EXTRACTION      No current facility-administered medications for this encounter.   Current Outpatient Medications  Medication Sig Dispense Refill Last Dose   acetaminophen (TYLENOL) 500 MG tablet Take 1,000 mg by mouth every 6 (six) hours as needed for moderate pain.      ARMOUR THYROID 90 MG tablet Take 90 mg by mouth in the morning.      clobetasol (TEMOVATE) 0.05 % external solution Apply 1 Application topically daily as needed (rash).      cromolyn (NASALCROM) 5.2 MG/ACT nasal spray Place 1 spray into both nostrils 4 (four) times daily as needed for allergies.      estradiol (VIVELLE-DOT) 0.05  MG/24HR patch Place 1 patch onto the skin 2 (two) times a week. Changes on Sun and Wen      fluocinonide (LIDEX) 0.05 % external solution Apply 1 Application topically 2 (two) times daily.      Ivermectin 1 % CREA Apply 1 Application topically at bedtime.      MAGNESIUM PO Take 1 tablet by mouth 4 (four) times a week.      Polyethyl Glycol-Propyl Glycol (SYSTANE OP) Place 1 drop into both eyes daily as needed (for dry eyes).      Psyllium (METAMUCIL PO) Take 1 Scoop by mouth daily.      traZODone  (DESYREL) 100 MG tablet Take 100 mg by mouth at bedtime.        zolpidem (AMBIEN) 5 MG tablet Take 5 mg by mouth at bedtime as needed for sleep.      Allergies  Allergen Reactions   Sulfonamide Derivatives Hives and Rash    urticarial    Sulfamethoxazole Hives   Hydrocodone-Acetaminophen Palpitations    Social History   Tobacco Use   Smoking status: Former    Packs/day: 0.25    Types: Cigarettes    Quit date: 04/02/1998    Years since quitting: 24.0   Smokeless tobacco: Never   Tobacco comments:    quit 2000  Substance Use Topics   Alcohol use: Yes    Alcohol/week: 14.0 standard drinks of alcohol    Types: 14 Glasses of wine per week    Family History  Problem Relation Age of Onset   Cancer Mother        colon, pancreatic,   Colon cancer Mother 85   Pancreatic cancer Mother    Cancer Father        lung, smoker   Hypertension Brother    Hyperlipidemia Brother    Colon polyps Brother    Lupus Paternal Aunt    Arthritis Paternal Aunt         RA in 2; 4 aunts with arthritis   Cancer Paternal Aunt        uterine   Heart disease Maternal Grandfather        MI @ 13   Cancer Maternal Grandfather        pancreatic   Pancreatic cancer Maternal Grandfather    Hyperlipidemia Paternal Grandmother    Stroke Paternal Grandmother    Tourette syndrome Son    Colon cancer Maternal Great-grandfather    Esophageal cancer Neg Hx    Rectal cancer Neg Hx    Stomach cancer Neg Hx    Breast cancer Neg Hx    Ovarian cancer Neg Hx    Endometrial cancer Neg Hx    Prostate cancer Neg Hx      Review of Systems  Constitutional:  Negative for chills and fever.  Respiratory:  Negative for cough and shortness of breath.   Cardiovascular:  Negative for chest pain.  Gastrointestinal:  Negative for nausea and vomiting.  Musculoskeletal:  Positive for arthralgias.     Objective:  Physical Exam Well nourished and well developed. General: Alert and oriented x3, cooperative and  pleasant, no acute distress. Head: normocephalic, atraumatic, neck supple. Eyes: EOMI.  Musculoskeletal: Left hip exam: Predominant finding is limitation and pain with hip flexion internal rotation over 5 degrees with external rotation to 20 degrees Slight external rotation contracture with active hip flexion with maintenance of strength at this point In comparison her right hip moves fluidly without reproducible pain though some tightness  Left knee exam reveals no palpable effusion, warmth erythema Slight tenderness medial and anteriorly Full knee extension and flexion  Calves soft and nontender. Motor function intact in LE. Strength 5/5 LE bilaterally. Neuro: Distal pulses 2+. Sensation to light touch intact in LE.  Vital signs in last 24 hours:    Labs:   Estimated body mass index is 24.97 kg/m as calculated from the following:   Height as of 04/09/22: '5\' 7"'$  (1.702 m).   Weight as of 04/09/22: 72.3 kg.   Imaging Review Plain radiographs demonstrate severe degenerative joint disease of the left hip(s). The bone quality appears to be adequate for age and reported activity level.      Assessment/Plan:  End stage arthritis, left hip(s)  The patient history, physical examination, clinical judgement of the provider and imaging studies are consistent with end stage degenerative joint disease of the left hip(s) and total hip arthroplasty is deemed medically necessary. The treatment options including medical management, injection therapy, arthroscopy and arthroplasty were discussed at length. The risks and benefits of total hip arthroplasty were presented and reviewed. The risks due to aseptic loosening, infection, stiffness, dislocation/subluxation,  thromboembolic complications and other imponderables were discussed.  The patient acknowledged the explanation, agreed to proceed with the plan and consent was signed. Patient is being admitted for inpatient treatment for surgery, pain  control, PT, OT, prophylactic antibiotics, VTE prophylaxis, progressive ambulation and ADL's and discharge planning.The patient is planning to be discharged  home.  Therapy Plans: HEP Disposition: Home with husband Planned DVT Prophylaxis: aspirin '81mg'$  BID DME needed: none PCP: Dr. Ardeth Perfect, clearance received TXA: IV Allergies: sulfa drugs - hives, hydrocodone - insomnia, tachycardia Anesthesia Concerns: none BMI: 25.3 Last HgbA1c: Not diabetic   Other: - Spending the night - tramadol/hydromorphone, robaxin, tylenol, meloxicam - Hx of VTE / cancer / MI/CVA   Costella Hatcher, PA-C Orthopedic Surgery [facility] [place] [phone]

## 2022-04-17 ENCOUNTER — Encounter (HOSPITAL_COMMUNITY): Payer: Self-pay | Admitting: Orthopedic Surgery

## 2022-04-17 ENCOUNTER — Encounter (HOSPITAL_COMMUNITY)
Admission: RE | Disposition: A | Discharge status: Home / Self Care | Payer: Self-pay | Source: Home / Self Care | Attending: Orthopedic Surgery

## 2022-04-17 ENCOUNTER — Ambulatory Visit (HOSPITAL_COMMUNITY): Payer: Medicare HMO | Admitting: Certified Registered Nurse Anesthetist

## 2022-04-17 ENCOUNTER — Ambulatory Visit (HOSPITAL_BASED_OUTPATIENT_CLINIC_OR_DEPARTMENT_OTHER): Payer: Medicare HMO | Admitting: Certified Registered Nurse Anesthetist

## 2022-04-17 ENCOUNTER — Observation Stay (HOSPITAL_COMMUNITY)
Admission: RE | Admit: 2022-04-17 | Discharge: 2022-04-18 | Disposition: A | Discharge status: Home / Self Care | Payer: Medicare HMO | Attending: Orthopedic Surgery | Admitting: Orthopedic Surgery

## 2022-04-17 ENCOUNTER — Ambulatory Visit (HOSPITAL_COMMUNITY): Payer: Medicare HMO

## 2022-04-17 ENCOUNTER — Other Ambulatory Visit: Payer: Self-pay

## 2022-04-17 DIAGNOSIS — E039 Hypothyroidism, unspecified: Secondary | ICD-10-CM | POA: Insufficient documentation

## 2022-04-17 DIAGNOSIS — M1612 Unilateral primary osteoarthritis, left hip: Secondary | ICD-10-CM | POA: Diagnosis not present

## 2022-04-17 DIAGNOSIS — Z79899 Other long term (current) drug therapy: Secondary | ICD-10-CM | POA: Insufficient documentation

## 2022-04-17 DIAGNOSIS — Z87891 Personal history of nicotine dependence: Secondary | ICD-10-CM | POA: Insufficient documentation

## 2022-04-17 DIAGNOSIS — Z96642 Presence of left artificial hip joint: Secondary | ICD-10-CM

## 2022-04-17 HISTORY — PX: TOTAL HIP ARTHROPLASTY: SHX124

## 2022-04-17 LAB — TYPE AND SCREEN
ABO/RH(D): O NEG
Antibody Screen: NEGATIVE

## 2022-04-17 SURGERY — ARTHROPLASTY, HIP, TOTAL, ANTERIOR APPROACH
Anesthesia: Spinal | Site: Hip | Laterality: Left

## 2022-04-17 MED ORDER — DEXAMETHASONE SODIUM PHOSPHATE 10 MG/ML IJ SOLN
10.0000 mg | Freq: Once | INTRAMUSCULAR | Status: AC
  Administered 2022-04-18: 10 mg via INTRAVENOUS
  Filled 2022-04-17: qty 1

## 2022-04-17 MED ORDER — CHLORHEXIDINE GLUCONATE 0.12 % MT SOLN
15.0000 mL | Freq: Once | OROMUCOSAL | Status: AC
  Administered 2022-04-17: 15 mL via OROMUCOSAL

## 2022-04-17 MED ORDER — PHENYLEPHRINE HCL-NACL 20-0.9 MG/250ML-% IV SOLN
INTRAVENOUS | Status: AC
  Filled 2022-04-17: qty 250

## 2022-04-17 MED ORDER — EPHEDRINE SULFATE-NACL 50-0.9 MG/10ML-% IV SOSY
PREFILLED_SYRINGE | INTRAVENOUS | Status: DC | PRN
  Administered 2022-04-17: 10 mg via INTRAVENOUS

## 2022-04-17 MED ORDER — ACETAMINOPHEN 500 MG PO TABS
1000.0000 mg | ORAL_TABLET | Freq: Four times a day (QID) | ORAL | Status: AC
  Administered 2022-04-17 – 2022-04-18 (×4): 1000 mg via ORAL
  Filled 2022-04-17 (×4): qty 2

## 2022-04-17 MED ORDER — TRAZODONE HCL 100 MG PO TABS
100.0000 mg | ORAL_TABLET | Freq: Every day | ORAL | Status: DC
  Administered 2022-04-17: 100 mg via ORAL
  Filled 2022-04-17: qty 1

## 2022-04-17 MED ORDER — DOCUSATE SODIUM 100 MG PO CAPS
100.0000 mg | ORAL_CAPSULE | Freq: Two times a day (BID) | ORAL | Status: DC
  Administered 2022-04-17: 100 mg via ORAL
  Filled 2022-04-17 (×2): qty 1

## 2022-04-17 MED ORDER — SODIUM CHLORIDE (PF) 0.9 % IJ SOLN
INTRAMUSCULAR | Status: AC
  Filled 2022-04-17: qty 30

## 2022-04-17 MED ORDER — METHOCARBAMOL 500 MG IVPB - SIMPLE MED: 500.0000 mg | Freq: Four times a day (QID) | INTRAVENOUS | Status: DC | PRN

## 2022-04-17 MED ORDER — PHENYLEPHRINE HCL-NACL 20-0.9 MG/250ML-% IV SOLN
INTRAVENOUS | Status: DC | PRN
  Administered 2022-04-17: 40 ug/min via INTRAVENOUS

## 2022-04-17 MED ORDER — LACTATED RINGERS IV SOLN: INTRAVENOUS | Status: DC

## 2022-04-17 MED ORDER — METHOCARBAMOL 500 MG PO TABS
500.0000 mg | ORAL_TABLET | Freq: Four times a day (QID) | ORAL | Status: DC | PRN
  Administered 2022-04-17: 500 mg via ORAL
  Filled 2022-04-17: qty 1

## 2022-04-17 MED ORDER — POLYETHYLENE GLYCOL 3350 17 G PO PACK
17.0000 g | PACK | Freq: Two times a day (BID) | ORAL | Status: DC
  Filled 2022-04-17: qty 1

## 2022-04-17 MED ORDER — SODIUM CHLORIDE (PF) 0.9 % IJ SOLN
INTRAMUSCULAR | Status: DC | PRN
  Administered 2022-04-17: 30 mL

## 2022-04-17 MED ORDER — ONDANSETRON HCL 4 MG/2ML IJ SOLN: 4.0000 mg | Freq: Four times a day (QID) | INTRAMUSCULAR | Status: DC | PRN

## 2022-04-17 MED ORDER — PHENOL 1.4 % MT LIQD: 1.0000 | OROMUCOSAL | Status: DC | PRN

## 2022-04-17 MED ORDER — PROPOFOL 1000 MG/100ML IV EMUL
INTRAVENOUS | Status: AC
  Filled 2022-04-17: qty 100

## 2022-04-17 MED ORDER — CEFAZOLIN SODIUM-DEXTROSE 2-4 GM/100ML-% IV SOLN
2.0000 g | INTRAVENOUS | Status: AC
  Administered 2022-04-17: 2 g via INTRAVENOUS
  Filled 2022-04-17: qty 100

## 2022-04-17 MED ORDER — FENTANYL CITRATE PF 50 MCG/ML IJ SOSY: 25.0000 ug | PREFILLED_SYRINGE | INTRAMUSCULAR | Status: DC | PRN

## 2022-04-17 MED ORDER — METOCLOPRAMIDE HCL 5 MG PO TABS: 5.0000 mg | ORAL_TABLET | Freq: Three times a day (TID) | ORAL | Status: DC | PRN

## 2022-04-17 MED ORDER — EPHEDRINE 5 MG/ML INJ
INTRAVENOUS | Status: AC
  Filled 2022-04-17: qty 5

## 2022-04-17 MED ORDER — CROMOLYN SODIUM 5.2 MG/ACT NA AERS: 1.0000 | INHALATION_SPRAY | Freq: Four times a day (QID) | NASAL | Status: DC | PRN

## 2022-04-17 MED ORDER — MENTHOL 3 MG MT LOZG: 1.0000 | LOZENGE | OROMUCOSAL | Status: DC | PRN

## 2022-04-17 MED ORDER — ONDANSETRON HCL 4 MG/2ML IJ SOLN
INTRAMUSCULAR | Status: AC
  Filled 2022-04-17: qty 2

## 2022-04-17 MED ORDER — TRAMADOL HCL 50 MG PO TABS
50.0000 mg | ORAL_TABLET | Freq: Four times a day (QID) | ORAL | Status: DC | PRN
  Administered 2022-04-17: 50 mg via ORAL
  Filled 2022-04-17: qty 1

## 2022-04-17 MED ORDER — HYDROMORPHONE HCL 1 MG/ML IJ SOLN: 0.5000 mg | INTRAMUSCULAR | Status: DC | PRN

## 2022-04-17 MED ORDER — DEXAMETHASONE SODIUM PHOSPHATE 10 MG/ML IJ SOLN
8.0000 mg | Freq: Once | INTRAMUSCULAR | Status: AC
  Administered 2022-04-17: 8 mg via INTRAVENOUS

## 2022-04-17 MED ORDER — POVIDONE-IODINE 10 % EX SWAB
2.0000 | Freq: Once | CUTANEOUS | Status: AC
  Administered 2022-04-17: 2 via TOPICAL

## 2022-04-17 MED ORDER — ONDANSETRON HCL 4 MG PO TABS: 4.0000 mg | ORAL_TABLET | Freq: Four times a day (QID) | ORAL | Status: DC | PRN

## 2022-04-17 MED ORDER — DEXAMETHASONE SODIUM PHOSPHATE 10 MG/ML IJ SOLN
INTRAMUSCULAR | Status: AC
  Filled 2022-04-17: qty 1

## 2022-04-17 MED ORDER — CEFAZOLIN SODIUM-DEXTROSE 2-4 GM/100ML-% IV SOLN: 2.0000 g | INTRAVENOUS | Status: DC

## 2022-04-17 MED ORDER — BUPIVACAINE IN DEXTROSE 0.75-8.25 % IT SOLN
INTRATHECAL | Status: DC | PRN
  Administered 2022-04-17: 1.8 mL via INTRATHECAL

## 2022-04-17 MED ORDER — HYDROMORPHONE HCL 2 MG PO TABS
1.0000 mg | ORAL_TABLET | ORAL | Status: DC | PRN
  Administered 2022-04-18 (×3): 2 mg via ORAL
  Filled 2022-04-17 (×3): qty 1

## 2022-04-17 MED ORDER — BISACODYL 10 MG RE SUPP: 10.0000 mg | Freq: Every day | RECTAL | Status: DC | PRN

## 2022-04-17 MED ORDER — 0.9 % SODIUM CHLORIDE (POUR BTL) OPTIME
TOPICAL | Status: DC | PRN
  Administered 2022-04-17: 1000 mL

## 2022-04-17 MED ORDER — PROPOFOL 500 MG/50ML IV EMUL
INTRAVENOUS | Status: DC | PRN
  Administered 2022-04-17: 50 ug/kg/min via INTRAVENOUS

## 2022-04-17 MED ORDER — MIDAZOLAM HCL 2 MG/2ML IJ SOLN
INTRAMUSCULAR | Status: AC
  Filled 2022-04-17: qty 2

## 2022-04-17 MED ORDER — TRANEXAMIC ACID-NACL 1000-0.7 MG/100ML-% IV SOLN
1000.0000 mg | Freq: Once | INTRAVENOUS | Status: AC
  Administered 2022-04-17: 1000 mg via INTRAVENOUS
  Filled 2022-04-17: qty 100

## 2022-04-17 MED ORDER — PHENYLEPHRINE 80 MCG/ML (10ML) SYRINGE FOR IV PUSH (FOR BLOOD PRESSURE SUPPORT)
PREFILLED_SYRINGE | INTRAVENOUS | Status: AC
  Filled 2022-04-17: qty 10

## 2022-04-17 MED ORDER — FENTANYL CITRATE (PF) 100 MCG/2ML IJ SOLN
INTRAMUSCULAR | Status: AC
  Filled 2022-04-17: qty 2

## 2022-04-17 MED ORDER — STERILE WATER FOR IRRIGATION IR SOLN
Status: DC | PRN
  Administered 2022-04-17: 2000 mL

## 2022-04-17 MED ORDER — THYROID 60 MG PO TABS
90.0000 mg | ORAL_TABLET | Freq: Every day | ORAL | Status: DC
  Administered 2022-04-18: 90 mg via ORAL
  Filled 2022-04-17: qty 1

## 2022-04-17 MED ORDER — KETOROLAC TROMETHAMINE 30 MG/ML IJ SOLN
INTRAMUSCULAR | Status: AC
  Filled 2022-04-17: qty 1

## 2022-04-17 MED ORDER — PROPOFOL 10 MG/ML IV BOLUS
INTRAVENOUS | Status: AC
  Filled 2022-04-17: qty 20

## 2022-04-17 MED ORDER — MIDAZOLAM HCL 5 MG/5ML IJ SOLN
INTRAMUSCULAR | Status: DC | PRN
  Administered 2022-04-17: 2 mg via INTRAVENOUS

## 2022-04-17 MED ORDER — CEFAZOLIN SODIUM-DEXTROSE 2-4 GM/100ML-% IV SOLN
2.0000 g | Freq: Four times a day (QID) | INTRAVENOUS | Status: AC
  Administered 2022-04-17 – 2022-04-18 (×2): 2 g via INTRAVENOUS
  Filled 2022-04-17 (×2): qty 100

## 2022-04-17 MED ORDER — BUPIVACAINE HCL (PF) 0.25 % IJ SOLN
INTRAMUSCULAR | Status: AC
  Filled 2022-04-17: qty 30

## 2022-04-17 MED ORDER — CLOBETASOL PROPIONATE 0.05 % EX SOLN: 1.0000 | Freq: Every day | CUTANEOUS | Status: DC | PRN

## 2022-04-17 MED ORDER — ACETAMINOPHEN 325 MG PO TABS: 325.0000 mg | ORAL_TABLET | Freq: Four times a day (QID) | ORAL | Status: DC | PRN

## 2022-04-17 MED ORDER — PROPOFOL 10 MG/ML IV BOLUS
INTRAVENOUS | Status: DC | PRN
  Administered 2022-04-17 (×2): 20 mg via INTRAVENOUS
  Administered 2022-04-17: 10 mg via INTRAVENOUS
  Administered 2022-04-17 (×3): 20 mg via INTRAVENOUS

## 2022-04-17 MED ORDER — ORAL CARE MOUTH RINSE: 15.0000 mL | Freq: Once | OROMUCOSAL | Status: AC

## 2022-04-17 MED ORDER — METOCLOPRAMIDE HCL 5 MG/ML IJ SOLN: 5.0000 mg | Freq: Three times a day (TID) | INTRAMUSCULAR | Status: DC | PRN

## 2022-04-17 MED ORDER — ASPIRIN 81 MG PO CHEW
81.0000 mg | CHEWABLE_TABLET | Freq: Two times a day (BID) | ORAL | Status: DC
  Administered 2022-04-17 – 2022-04-18 (×2): 81 mg via ORAL
  Filled 2022-04-17 (×2): qty 1

## 2022-04-17 MED ORDER — ONDANSETRON HCL 4 MG/2ML IJ SOLN
INTRAMUSCULAR | Status: DC | PRN
  Administered 2022-04-17: 4 mg via INTRAVENOUS

## 2022-04-17 MED ORDER — SODIUM CHLORIDE 0.9 % IV SOLN: INTRAVENOUS | Status: DC

## 2022-04-17 MED ORDER — TRANEXAMIC ACID-NACL 1000-0.7 MG/100ML-% IV SOLN
1000.0000 mg | INTRAVENOUS | Status: AC
  Administered 2022-04-17: 1000 mg via INTRAVENOUS
  Filled 2022-04-17: qty 100

## 2022-04-17 MED ORDER — FENTANYL CITRATE (PF) 100 MCG/2ML IJ SOLN
INTRAMUSCULAR | Status: DC | PRN
  Administered 2022-04-17: 50 ug via INTRAVENOUS

## 2022-04-17 MED ORDER — BUPIVACAINE HCL (PF) 0.25 % IJ SOLN
INTRAMUSCULAR | Status: DC | PRN
  Administered 2022-04-17: 30 mL

## 2022-04-17 MED ORDER — ZOLPIDEM TARTRATE 5 MG PO TABS: 5.0000 mg | ORAL_TABLET | Freq: Every evening | ORAL | Status: DC | PRN

## 2022-04-17 MED ORDER — KETOROLAC TROMETHAMINE 30 MG/ML IJ SOLN
INTRAMUSCULAR | Status: DC | PRN
  Administered 2022-04-17: 30 mg

## 2022-04-17 MED ORDER — ACETAMINOPHEN 500 MG PO TABS
1000.0000 mg | ORAL_TABLET | Freq: Once | ORAL | Status: AC
  Administered 2022-04-17: 1000 mg via ORAL
  Filled 2022-04-17: qty 2

## 2022-04-17 MED ORDER — PSYLLIUM 95 % PO PACK
1.0000 | PACK | Freq: Every day | ORAL | Status: DC
  Filled 2022-04-17 (×2): qty 1

## 2022-04-17 MED ORDER — DIPHENHYDRAMINE HCL 12.5 MG/5ML PO ELIX: 12.5000 mg | ORAL_SOLUTION | ORAL | Status: DC | PRN

## 2022-04-17 SURGICAL SUPPLY — 41 items
ADH SKN CLS APL DERMABOND .7 (GAUZE/BANDAGES/DRESSINGS) ×1
BAG COUNTER SPONGE SURGICOUNT (BAG) IMPLANT
BAG DECANTER FOR FLEXI CONT (MISCELLANEOUS) IMPLANT
BAG SPEC THK2 15X12 ZIP CLS (MISCELLANEOUS)
BAG SPNG CNTER NS LX DISP (BAG) ×1
BAG ZIPLOCK 12X15 (MISCELLANEOUS) IMPLANT
BLADE SAG 18X100X1.27 (BLADE) ×2 IMPLANT
COVER PERINEAL POST (MISCELLANEOUS) ×2 IMPLANT
COVER SURGICAL LIGHT HANDLE (MISCELLANEOUS) ×2 IMPLANT
CUP ACETBLR 52 OD PINNACLE (Hips) IMPLANT
DERMABOND ADVANCED .7 DNX12 (GAUZE/BANDAGES/DRESSINGS) ×2 IMPLANT
DRAPE FOOT SWITCH (DRAPES) ×2 IMPLANT
DRAPE STERI IOBAN 125X83 (DRAPES) ×2 IMPLANT
DRAPE U-SHAPE 47X51 STRL (DRAPES) ×4 IMPLANT
DRESSING AQUACEL AG SP 3.5X10 (GAUZE/BANDAGES/DRESSINGS) ×2 IMPLANT
DRSG AQUACEL AG ADV 3.5X10 (GAUZE/BANDAGES/DRESSINGS) IMPLANT
DRSG AQUACEL AG SP 3.5X10 (GAUZE/BANDAGES/DRESSINGS) ×1
DURAPREP 26ML APPLICATOR (WOUND CARE) ×2 IMPLANT
ELECT REM PT RETURN 15FT ADLT (MISCELLANEOUS) ×2 IMPLANT
GLOVE BIO SURGEON STRL SZ 6 (GLOVE) ×2 IMPLANT
GLOVE BIOGEL PI IND STRL 6.5 (GLOVE) ×2 IMPLANT
GLOVE BIOGEL PI IND STRL 7.5 (GLOVE) ×2 IMPLANT
GLOVE ORTHO TXT STRL SZ7.5 (GLOVE) ×4 IMPLANT
GOWN STRL REUS W/ TWL LRG LVL3 (GOWN DISPOSABLE) ×4 IMPLANT
GOWN STRL REUS W/TWL LRG LVL3 (GOWN DISPOSABLE) ×2
HEAD CERAMIC DELTA 36 PLUS 1.5 (Hips) IMPLANT
HOLDER FOLEY CATH W/STRAP (MISCELLANEOUS) ×2 IMPLANT
KIT TURNOVER KIT A (KITS) IMPLANT
LINER NEUTRAL 52X36MM PLUS 4 (Liner) IMPLANT
PACK ANTERIOR HIP CUSTOM (KITS) ×2 IMPLANT
SCREW 6.5MMX30MM (Screw) IMPLANT
STEM FEM ACTIS HIGH SZ3 (Stem) IMPLANT
SUT MNCRL AB 4-0 PS2 18 (SUTURE) ×2 IMPLANT
SUT STRATAFIX 0 PDS 27 VIOLET (SUTURE) ×1
SUT VIC AB 1 CT1 36 (SUTURE) ×6 IMPLANT
SUT VIC AB 2-0 CT1 27 (SUTURE) ×2
SUT VIC AB 2-0 CT1 TAPERPNT 27 (SUTURE) ×4 IMPLANT
SUTURE STRATFX 0 PDS 27 VIOLET (SUTURE) ×2 IMPLANT
TRAY FOLEY W/BAG SLVR 14FR LF (SET/KITS/TRAYS/PACK) IMPLANT
TUBE SUCTION HIGH CAP CLEAR NV (SUCTIONS) ×2 IMPLANT
WATER STERILE IRR 1000ML POUR (IV SOLUTION) ×2 IMPLANT

## 2022-04-17 NOTE — Interval H&P Note (Signed)
History and Physical Interval Note:  04/17/2022 12:37 PM  Helen Gardner  has presented today for surgery, with the diagnosis of Left hip osteoarthritis.  The various methods of treatment have been discussed with the patient and family. After consideration of risks, benefits and other options for treatment, the patient has consented to  Procedure(s): TOTAL HIP ARTHROPLASTY ANTERIOR APPROACH (Left) as a surgical intervention.  The patient's history has been reviewed, patient examined, no change in status, stable for surgery.  I have reviewed the patient's chart and labs.  Questions were answered to the patient's satisfaction.     [name]

## 2022-04-17 NOTE — Discharge Instructions (Signed)

## 2022-04-17 NOTE — Progress Notes (Signed)
PT Cancellation Note  Patient Details Name: Helen Gardner MRN: [id number] DOB: 1945-12-14   Cancelled Treatment:    Reason Eval/Treat Not Completed: (P) Patient not medically ready: pt still with b/l numbness per RN, suspect spinal still in effect. Will follow up as schedule and pt status allows which may be another day.  Coolidge Breeze, PT, DPT [place] Office: [phone] Weekend pager: [phone]   Coolidge Breeze 04/17/2022, 6:31 PM

## 2022-04-17 NOTE — Op Note (Signed)
NAME:  Helen Gardner NO.: [id number]      MEDICAL RECORD NO.: [id number]      FACILITY:  [facility]      PHYSICIAN:  [name]  DATE OF BIRTH:  September 02, 1945     DATE OF PROCEDURE:  04/17/2022                                 OPERATIVE REPORT         PREOPERATIVE DIAGNOSIS: Left  hip osteoarthritis.      POSTOPERATIVE DIAGNOSIS:  Left hip osteoarthritis.      PROCEDURE:  Left total hip replacement through an anterior approach   utilizing DePuy THR system, component size 52 mm pinnacle cup, a size 36+4 neutral   Altrex liner, a size 3 Hi Actis stem with a 36+1.5 delta ceramic   ball.      SURGEON:  Pietro Cassis. Alvan Dame, M.D.      ASSISTANT:  Costella Hatcher, PA-C     ANESTHESIA:  Spinal.      SPECIMENS:  None.      COMPLICATIONS:  None.      BLOOD LOSS:  350 cc     DRAINS:  None.      INDICATION OF THE PROCEDURE:  Helen Gardner is a 77 y.o. female who had   presented to office for evaluation of left hip pain.  Radiographs revealed   progressive degenerative changes with bone-on-bone   articulation of the  hip joint, including subchondral cystic changes and osteophytes.  The patient had painful limited range of   motion significantly affecting their overall quality of life and function.  The patient was failing to    respond to conservative measures including medications and/or injections and activity modification and at this point was ready   to proceed with more definitive measures.  Consent was obtained for   benefit of pain relief.  Specific risks of infection, DVT, component   failure, dislocation, neurovascular injury, and need for revision surgery were reviewed in the office.     PROCEDURE IN DETAIL:  The patient was brought to operative theater.   Once adequate anesthesia, preoperative antibiotics, 2 gm of Ancef, 1 gm of Tranexamic Acid, and 10 mg of Decadron were administered, the patient was positioned supine on the  [employer] table.  Once the patient was safely positioned with adequate padding of boney prominences we predraped out the hip, and used fluoroscopy to confirm orientation of the pelvis.      The left hip was then prepped and draped from proximal iliac crest to   mid thigh with a shower curtain technique.      Time-out was performed identifying the patient, planned procedure, and the appropriate extremity.     An incision was then made 2 cm lateral to the   anterior superior iliac spine extending over the orientation of the   tensor fascia lata muscle and sharp dissection was carried down to the   fascia of the muscle.      The fascia was then incised.  The muscle belly was identified and swept   laterally and retractor placed along the superior neck.  Following   cauterization of the circumflex vessels and removing some pericapsular   fat, a second cobra retractor was placed on the inferior  neck.  A T-capsulotomy was made along the line of the   superior neck to the trochanteric fossa, then extended proximally and   distally.  Tag sutures were placed and the retractors were then placed   intracapsular.  We then identified the trochanteric fossa and   orientation of my neck cut and then made a neck osteotomy with the femur on traction.  The femoral   head was removed without difficulty or complication.  Traction was let   off and retractors were placed posterior and anterior around the   acetabulum.      The labrum and foveal tissue were debrided.  I began reaming with a 47 mm   reamer and reamed up to 51 mm reamer with good bony bed preparation and a 52 mm  cup was chosen.  The final 52 mm Pinnacle cup was then impacted under fluoroscopy to confirm the depth of penetration and orientation with respect to   Abduction and forward flexion.  A screw was placed into the ilium followed by the hole eliminator.  The final   36+4 neutral Altrex liner was impacted with good visualized rim fit.  The  cup was positioned anatomically within the acetabular portion of the pelvis.      At this point, the femur was rolled to 100 degrees.  Further capsule was   released off the inferior aspect of the femoral neck.  I then   released the superior capsule proximally.  With the leg in a neutral position the hook was placed laterally   along the femur under the vastus lateralis origin and elevated manually and then held in position using the hook attachment on the bed.  The leg was then extended and adducted with the leg rolled to 100   degrees of external rotation.  Retractors were placed along the medial calcar and posteriorly over the greater trochanter.  Once the proximal femur was fully   exposed, I used a box osteotome to set orientation.  I then began   broaching with the starting chili pepper broach and passed this by hand and then broached up to 3.  With the 3 broach in place I chose a high offset neck and did several trial reductions.  The offset was appropriate, leg lengths   appeared to be equal best matched with the +1.5 head ball trial confirmed radiographically.   Given these findings, I went ahead and dislocated the hip, repositioned all   retractors and positioned the right hip in the extended and abducted position.  The final 3 Hi Actis stem was   chosen and it was impacted down to the level of neck cut.  Based on this   and the trial reductions, a final 36+1.5 delta ceramic ball was chosen and   impacted onto a clean and dry trunnion, and the hip was reduced.  The   hip had been irrigated throughout the case again at this point.  I did   reapproximate the superior capsular leaflet to the anterior leaflet   using #1 Vicryl.  The fascia of the   tensor fascia lata muscle was then reapproximated using #1 Vicryl and #0 Stratafix sutures.  The   remaining wound was closed with 2-0 Vicryl and running 4-0 Monocryl.   The hip was cleaned, dried, and dressed sterilely using Dermabond and    Aquacel dressing.  The patient was then brought   to recovery room in stable condition tolerating the procedure well.    Caryl Pina  Lu Duffel, PA-C was present for the entirety of the case involved from   preoperative positioning, perioperative retractor management, general   facilitation of the case, as well as primary wound closure as assistant.            Pietro Cassis Alvan Dame, M.D.        04/17/2022 12:38 PM

## 2022-04-17 NOTE — Transfer of Care (Signed)
Immediate Anesthesia Transfer of Care Note  Patient: Helen Gardner  Procedure(s) Performed: TOTAL HIP ARTHROPLASTY ANTERIOR APPROACH (Left: Hip)  Patient Location: PACU  Anesthesia Type:Spinal  Level of Consciousness: drowsy and patient cooperative  Airway & Oxygen Therapy: Patient Spontanous Breathing and Patient connected to nasal cannula oxygen  Post-op Assessment: Report given to RN and Post -op Vital signs reviewed and stable  Post vital signs: Reviewed and stable  Last Vitals:  Vitals Value Taken Time  BP 115/61 04/17/22 1522  Temp    Pulse 81 04/17/22 1525  Resp 17 04/17/22 1525  SpO2 100 % 04/17/22 1525  Vitals shown include unvalidated device data.  Last Pain:  Vitals:   04/17/22 1244  TempSrc:   PainSc: 0-No pain         Complications: No notable events documented.

## 2022-04-17 NOTE — Anesthesia Preprocedure Evaluation (Addendum)
Anesthesia Evaluation  Patient identified by MRN, date of birth, ID band Patient awake    Reviewed: Allergy & Precautions, NPO status , Patient's Chart, lab work & pertinent test results  Airway Mallampati: III  TM Distance: >3 FB Neck ROM: Full    Dental  (+) Dental Advisory Given, Teeth Intact   Pulmonary former smoker   Pulmonary exam normal breath sounds clear to auscultation       Cardiovascular negative cardio ROS Normal cardiovascular exam Rhythm:Regular Rate:Normal     Neuro/Psych  Headaches  negative psych ROS   GI/Hepatic negative GI ROS, Neg liver ROS,,,  Endo/Other  Hypothyroidism    Renal/GU negative Renal ROS  negative genitourinary   Musculoskeletal  (+) Arthritis ,  Fibromyalgia -  Abdominal   Peds  Hematology negative hematology ROS (+)   Anesthesia Other Findings   Reproductive/Obstetrics                             Anesthesia Physical Anesthesia Plan  ASA: 2  Anesthesia Plan: Spinal   Post-op Pain Management: Tylenol PO (pre-op)*   Induction:   PONV Risk Score and Plan: 2 and Treatment may vary due to age or medical condition, Propofol infusion, Ondansetron and Dexamethasone  Airway Management Planned: Natural Airway  Additional Equipment:   Intra-op Plan:   Post-operative Plan:   Informed Consent: I have reviewed the patients History and Physical, chart, labs and discussed the procedure including the risks, benefits and alternatives for the proposed anesthesia with the patient or authorized representative who has indicated his/her understanding and acceptance.     Dental advisory given  Plan Discussed with: CRNA  Anesthesia Plan Comments:        Anesthesia Quick Evaluation

## 2022-04-17 NOTE — Anesthesia Procedure Notes (Signed)
Spinal  Patient location during procedure: OR Start time: 04/17/2022 1:47 PM End time: 04/17/2022 1:53 PM Reason for block: surgical anesthesia Staffing Performed: resident/CRNA  Anesthesiologist: Freddrick March, MD Resident/CRNA: Montel Clock, CRNA Performed by: Montel Clock, CRNA Authorized by: Freddrick March, MD   Preanesthetic Checklist Completed: patient identified, IV checked, risks and benefits discussed, surgical consent, monitors and equipment checked, pre-op evaluation and timeout performed Spinal Block Patient position: sitting Prep: DuraPrep Patient monitoring: heart rate, cardiac monitor, continuous pulse ox and blood pressure Approach: midline Location: L3-4 Injection technique: single-shot Needle Needle type: Pencan  Needle gauge: 24 G Needle length: 10 cm Needle insertion depth: 7 cm Assessment Sensory level: T6 Events: CSF return

## 2022-04-18 ENCOUNTER — Encounter (HOSPITAL_COMMUNITY): Payer: Self-pay | Admitting: Orthopedic Surgery

## 2022-04-18 DIAGNOSIS — M1612 Unilateral primary osteoarthritis, left hip: Secondary | ICD-10-CM | POA: Diagnosis not present

## 2022-04-18 DIAGNOSIS — Z87891 Personal history of nicotine dependence: Secondary | ICD-10-CM | POA: Diagnosis not present

## 2022-04-18 DIAGNOSIS — Z79899 Other long term (current) drug therapy: Secondary | ICD-10-CM | POA: Diagnosis not present

## 2022-04-18 DIAGNOSIS — E039 Hypothyroidism, unspecified: Secondary | ICD-10-CM | POA: Diagnosis not present

## 2022-04-18 LAB — CBC
HCT: 32 % — ABNORMAL LOW (ref 36.0–46.0)
Hemoglobin: 10.5 g/dL — ABNORMAL LOW (ref 12.0–15.0)
MCH: 29.5 pg (ref 26.0–34.0)
MCHC: 32.8 g/dL (ref 30.0–36.0)
MCV: 89.9 fL (ref 80.0–100.0)
Platelets: 209 10*3/uL (ref 150–400)
RBC: 3.56 MIL/uL — ABNORMAL LOW (ref 3.87–5.11)
RDW: 12.9 % (ref 11.5–15.5)
WBC: 12.4 10*3/uL — ABNORMAL HIGH (ref 4.0–10.5)
nRBC: 0 % (ref 0.0–0.2)

## 2022-04-18 LAB — BASIC METABOLIC PANEL
Anion gap: 6 (ref 5–15)
BUN: 11 mg/dL (ref 8–23)
CO2: 24 mmol/L (ref 22–32)
Calcium: 8.1 mg/dL — ABNORMAL LOW (ref 8.9–10.3)
Chloride: 107 mmol/L (ref 98–111)
Creatinine, Ser: 0.68 mg/dL (ref 0.44–1.00)
GFR, Estimated: >60 mL/min (ref >60)
Glucose, Bld: 149 mg/dL — ABNORMAL HIGH (ref 70–99)
Potassium: 4.1 mmol/L (ref 3.5–5.1)
Sodium: 137 mmol/L (ref 135–145)

## 2022-04-18 MED ORDER — ASPIRIN 81 MG PO CHEW: 81.0000 mg | CHEWABLE_TABLET | Freq: Two times a day (BID) | ORAL | 0 refills | Status: AC

## 2022-04-18 MED ORDER — SENNA 8.6 MG PO TABS: 1.0000 | ORAL_TABLET | Freq: Every day | ORAL | 0 refills | Status: AC

## 2022-04-18 MED ORDER — HYDROMORPHONE HCL 2 MG PO TABS: 2.0000 mg | ORAL_TABLET | ORAL | 0 refills | Status: DC | PRN

## 2022-04-18 MED ORDER — POLYETHYLENE GLYCOL 3350 17 G PO PACK: 17.0000 g | PACK | Freq: Two times a day (BID) | ORAL | 0 refills | Status: DC

## 2022-04-18 MED ORDER — METHOCARBAMOL 500 MG PO TABS: 500.0000 mg | ORAL_TABLET | Freq: Four times a day (QID) | ORAL | 2 refills | Status: DC | PRN

## 2022-04-18 NOTE — Plan of Care (Signed)
  Problem: Education: Goal: Knowledge of the prescribed therapeutic regimen will improve Outcome: Progressing   Problem: Activity: Goal: Ability to tolerate increased activity will improve Outcome: Progressing   Problem: Pain Management: Goal: Pain level will decrease with appropriate interventions Outcome: Progressing   Problem: Safety: Goal: Ability to remain free from injury will improve Outcome: Progressing

## 2022-04-18 NOTE — TOC Transition Note (Signed)
Transition of Care [facility]) - CM/SW Discharge Note  Patient Details  Name: Helen Gardner MRN: [id number] Date of Birth: June 22, 1945  Transition of Care [facility] CM/SW Contact:  Sherie Don, LCSW Phone Number: 04/18/2022, 10:21 AM  Clinical Narrative: Patient is expected to discharge home after working with PT. CSW met with patient to confirm discharge plan. Patient will discharge home with a home exercise program (HEP). Patient has a rolling walker, shower chair, and cane at home so there are no DME needs at this time. TOC signing off.   Final next level of care: Home/Self Care Barriers to Discharge: No Barriers Identified  Patient Goals and CMS Choice Choice offered to / list presented to : NA  Discharge Plan and Services Additional resources added to the After Visit Summary for          DME Arranged: N/A DME Agency: NA HH Arranged: NA HH Agency: NA  Social Determinants of Health (SDOH) Interventions SDOH Screenings   Food Insecurity: No Food Insecurity (04/17/2022)  Housing: Low Risk  (04/17/2022)  Transportation Needs: No Transportation Needs (04/17/2022)  Utilities: Not At Risk (04/17/2022)  Tobacco Use: Medium Risk (04/17/2022)   Readmission Risk Interventions     No data to display

## 2022-04-18 NOTE — Evaluation (Signed)
Physical Therapy Evaluation Patient Details Name: Helen Gardner MRN: [id number] DOB: February 27, 1946 Today's Date: 04/18/2022  History of Present Illness  Pt is a 77yo female presenting s/p L-THA, AA on 04/17/22. PMH: anemia, chronic fatigue syndrome, fibromyalgia, HLD, hx of uterine cancer, hypothyroidism, IBS.  Clinical Impression  Pt is s/p THA resulting in the deficits listed below (see PT Problem List).  Doing well, pain controlled with mobility. Anticipate d/c after pm session  Pt will benefit from skilled PT to increase their independence and safety with mobility to allow discharge to the venue listed below.         Recommendations for follow up therapy are one component of a multi-disciplinary discharge planning process, led by the attending physician.  Recommendations may be updated based on patient status, additional functional criteria and insurance authorization.  Follow Up Recommendations Follow physician's recommendations for discharge plan and follow up therapies (HEP)      Assistance Recommended at Discharge    Patient can return home with the following  Help with stairs or ramp for entrance;Assist for transportation;Assistance with cooking/housework    Equipment Recommendations None recommended by PT  Recommendations for Other Services       Functional Status Assessment Patient has had a recent decline in their functional status and demonstrates the ability to make significant improvements in function in a reasonable and predictable amount of time.     Precautions / Restrictions Precautions Precautions: Fall Restrictions Weight Bearing Restrictions: No Other Position/Activity Restrictions: WBAT      Mobility  Bed Mobility Overal bed mobility: Needs Assistance Bed Mobility: Supine to Sit     Supine to sit: Supervision     General bed mobility comments: no physical assist, cues for technique and use of gait belt as leg lifter    Transfers Overall  transfer level: Needs assistance Equipment used: Rolling walker (2 wheels) Transfers: Sit to/from Stand Sit to Stand: Supervision           General transfer comment: cues for hand placement and LLE position    Ambulation/Gait Ambulation/Gait assistance: Min guard, Supervision Gait Distance (Feet): 100 Feet Assistive device: Rolling walker (2 wheels) Gait Pattern/deviations: Step-to pattern, Decreased stance time - left       General Gait Details: cues for sequence and RW position  Stairs            Wheelchair Mobility    Modified Rankin (Stroke Patients Only)       Balance Overall balance assessment: No apparent balance deficits (not formally assessed)                                           Pertinent Vitals/Pain Pain Assessment Pain Assessment: 0-10 Pain Score: 5  Pain Location: left hip Pain Descriptors / Indicators: Aching Pain Intervention(s): Limited activity within patient's tolerance, Monitored during session, Repositioned, Premedicated before session    Home Living Family/patient expects to be discharged to:: Private residence Living Arrangements: Spouse/significant other Available Help at Discharge: Family Type of Home: House Home Access: Stairs to enter   [employer] of Steps: 1 and threshold   Home Layout: Two level;Able to live on main level with bedroom/bathroom Home Equipment: Rolling Walker (2 wheels);Shower seat;Cane - single point ("walking stick") Additional Comments: husband getting BSC    Prior Function Prior Level of Function : Independent/Modified Independent  Hand Dominance        Extremity/Trunk Assessment   Upper Extremity Assessment Upper Extremity Assessment: Overall WFL for tasks assessed    Lower Extremity Assessment Lower Extremity Assessment: LLE deficits/detail LLE Deficits / Details: ankle WFL, knee and hip grossly 2+/5, ROM limited by pain/muscle  guarding       Communication      Cognition Arousal/Alertness: Awake/alert Behavior During Therapy: WFL for tasks assessed/performed Overall Cognitive Status: Within Functional Limits for tasks assessed                                          General Comments      Exercises     Assessment/Plan    PT Assessment Patient needs continued PT services  PT Problem List Decreased strength;Decreased range of motion;Decreased activity tolerance;Decreased balance;Decreased mobility;Pain;Decreased knowledge of use of DME       PT Treatment Interventions DME instruction;Therapeutic exercise;Gait training;Stair training;Functional mobility training;Therapeutic activities;Patient/family education    PT Goals (Current goals can be found in the Care Plan section)  Acute Rehab PT Goals PT Goal Formulation: With patient Time For Goal Achievement: 04/25/22 Potential to Achieve Goals: Good    Frequency 7X/week     Co-evaluation               AM-PAC PT "6 Clicks" Mobility  Outcome Measure Help needed turning from your back to your side while in a flat bed without using bedrails?: None Help needed moving from lying on your back to sitting on the side of a flat bed without using bedrails?: None Help needed moving to and from a bed to a chair (including a wheelchair)?: A Little Help needed standing up from a chair using your arms (e.g., wheelchair or bedside chair)?: A Little Help needed to walk in hospital room?: A Little Help needed climbing 3-5 steps with a railing? : A Little 6 Click Score: 20    End of Session Equipment Utilized During Treatment: Gait belt Activity Tolerance: Patient tolerated treatment well Patient left: in chair;with call bell/phone within reach;with chair alarm set Nurse Communication: Mobility status PT Visit Diagnosis: Other abnormalities of gait and mobility (R26.89);Difficulty in walking, not elsewhere classified (R26.2)    Time:  7654-6503 PT Time Calculation (min) (ACUTE ONLY): 31 min   Charges:   PT Evaluation $PT Eval Low Complexity: 1 Low PT Treatments $Gait Training: 8-22 mins        Baxter Flattery, PT  Acute Rehab Dept [facility]) [phone]  [facility] Weekend Pager [facility] only)  [phone]  04/18/2022   [facility] 04/18/2022, 11:41 AM

## 2022-04-18 NOTE — Progress Notes (Signed)
   Subjective: 1 Day Post-Op Procedure(s) (LRB): TOTAL HIP ARTHROPLASTY ANTERIOR APPROACH (Left) Patient reports pain as mild.   Patient seen in rounds by Dr. Alvan Dame. Patient is resting in bed on exam this morning. No acute events overnight. Foley catheter removed. Patient has not been up with PT yet.  We will start therapy today.   Objective: Vital signs in last 24 hours: Temp:  [96.6 F (35.9 C)-98.2 F (36.8 C)] 97.9 F (36.6 C) (01/17 0512) Pulse Rate:  [57-96] 91 (01/17 0512) Resp:  [11-18] 18 (01/17 0512) BP: (115-165)/(61-86) 119/65 (01/17 0512) SpO2:  [95 %-100 %] 96 % (01/17 0512) Weight:  [72 kg] 72 kg (01/16 1244)  Intake/Output from previous day:  Intake/Output Summary (Last 24 hours) at 04/18/2022 0728 Last data filed at 04/18/2022 0230 Gross per 24 hour  Intake 3411.35 ml  Output 2350 ml  Net 1061.35 ml     Intake/Output this shift: No intake/output data recorded.  Labs: Recent Labs    04/18/22 0339  HGB 10.5*   Recent Labs    04/18/22 0339  WBC 12.4*  RBC 3.56*  HCT 32.0*  PLT 209   Recent Labs    04/18/22 0339  NA 137  K 4.1  CL 107  CO2 24  BUN 11  CREATININE 0.68  GLUCOSE 149*  CALCIUM 8.1*   No results for input(s): "LABPT", "INR" in the last 72 hours.  Exam: General - Patient is Alert and Oriented Extremity - Neurologically intact Sensation intact distally Intact pulses distally Dorsiflexion/Plantar flexion intact Dressing - dressing C/D/I Motor Function - intact, moving foot and toes well on exam.   Past Medical History:  Diagnosis Date   Anemia    Arthritis    Chronic cough    due to postnasal drainage   Chronic fatigue    Complex atypical endometrial hyperplasia    Fibromyalgia    Hyperlipidemia    LDL goal = < 120, ideally < 90   hypothyroidism    Hypothyroidism    IBS (irritable bowel syndrome)    intermittent symptoms   Migraine headache     Assessment/Plan: 1 Day Post-Op Procedure(s) (LRB): TOTAL HIP  ARTHROPLASTY ANTERIOR APPROACH (Left) Principal Problem:   S/P total left hip arthroplasty  Estimated body mass index is 24.86 kg/m as calculated from the following:   Height as of this encounter: '5\' 7"'$  (1.702 m).   Weight as of this encounter: 72 kg. Advance diet Up with therapy D/C IV fluids  DVT Prophylaxis - Aspirin Weight bearing as tolerated.  Hgb stable at 10.5 this AM.  Plan is to go Home after hospital stay.  Plan for discharge today if she is meeting goals with therapy. Follow up in the office in 2 weeks.   Griffith Citron, PA-C Orthopedic Surgery [phone] 04/18/2022, 7:28 AM

## 2022-04-18 NOTE — Care Plan (Signed)
Ortho Bundle Case Management Note  Patient Details  Name: Helen Gardner MRN: [id number] Date of Birth: 02/13/1946  L THA on 04-17-22 DCP:  Home with husband DME:  No needs, has a RW PT:  HEP                   DME Arranged:  N/A DME Agency:  NA  HH Arranged:  NA HH Agency:  NA  Additional Comments: Please contact me with any questions of if this plan should need to change.  Marianne Sofia, RN,CCM [facility]  [phone] 04/18/2022, 7:21 AM

## 2022-04-18 NOTE — Progress Notes (Signed)
Physical Therapy Treatment Patient Details Name: Helen Gardner MRN: [id number] DOB: 01/05/1946 Today's Date: 04/18/2022   History of Present Illness Pt is a 77yo female presenting s/p L-THA, AA on 04/17/22. PMH: anemia, chronic fatigue syndrome, fibromyalgia, HLD, hx of uterine cancer, hypothyroidism, IBS.    PT Comments    Excellent progress this afternoon, meeting goals. pt motivated to  to d/c home  today; husband will be assisting as needed.   Recommendations for follow up therapy are one component of a multi-disciplinary discharge planning process, led by the attending physician.  Recommendations may be updated based on patient status, additional functional criteria and insurance authorization.  Follow Up Recommendations  Follow physician's recommendations for discharge plan and follow up therapies     Assistance Recommended at Discharge Intermittent Supervision/Assistance  Patient can return home with the following Help with stairs or ramp for entrance;Assist for transportation;Assistance with cooking/housework   Equipment Recommendations  None recommended by PT    Recommendations for Other Services       Precautions / Restrictions Restrictions Other Position/Activity Restrictions: WBAT     Mobility  Bed Mobility Overal bed mobility: Needs Assistance Bed Mobility: Supine to Sit, Sit to Supine     Supine to sit: Supervision Sit to supine: Supervision   General bed mobility comments: no physical assist, cues for technique and use of gait belt as leg lifter    Transfers Overall transfer level: Needs assistance Equipment used: Rolling walker (2 wheels) Transfers: Sit to/from Stand Sit to Stand: Supervision           General transfer comment: cues for hand placement and LLE position    Ambulation/Gait Ambulation/Gait assistance: Supervision Gait Distance (Feet): 120 Feet Assistive device: Rolling walker (2 wheels) Gait Pattern/deviations: Step-to  pattern, Step-through pattern       General Gait Details: progression to step through with good tolerance ot incr wt shfit to LLE. steady gait with RW, no LOB   Stairs Stairs: Yes Stairs assistance: Min guard Stair Management: No rails, Step to pattern, Backwards, With walker Number of Stairs: 1 (x2) General stair comments: cues for techinqiue and sequence; good stability, no LOB   Wheelchair Mobility    Modified Rankin (Stroke Patients Only)       Balance Overall balance assessment: No apparent balance deficits (not formally assessed)                                          Cognition Arousal/Alertness: Awake/alert Behavior During Therapy: WFL for tasks assessed/performed Overall Cognitive Status: Within Functional Limits for tasks assessed                                          Exercises Total Joint Exercises Ankle Circles/Pumps: AROM, Both, 10 reps Quad Sets: AROM, Both, 10 reps Heel Slides: AROM, AAROM, Left, 5 reps Long Arc Quad: AROM, Left, 10 reps    General Comments General comments (skin integrity, edema, etc.): assisted with LB dressing      Pertinent Vitals/Pain Pain Assessment Pain Assessment: 0-10 Pain Score: 4  Pain Location: left hip Pain Descriptors / Indicators: Aching, Sore, Tightness Pain Intervention(s): Limited activity within patient's tolerance, Monitored during session, Premedicated before session, Repositioned    Home Living Family/patient expects to be discharged to:: Private residence Living  Arrangements: Spouse/significant other                 Additional Comments: husband getting BSC    Prior Function            PT Goals (current goals can now be found in the care plan section) Acute Rehab PT Goals PT Goal Formulation: With patient Time For Goal Achievement: 04/25/22 Potential to Achieve Goals: Good    Frequency    7X/week      PT Plan Current plan remains appropriate     Co-evaluation              AM-PAC PT "6 Clicks" Mobility   Outcome Measure  Help needed turning from your back to your side while in a flat bed without using bedrails?: None Help needed moving from lying on your back to sitting on the side of a flat bed without using bedrails?: None Help needed moving to and from a bed to a chair (including a wheelchair)?: None Help needed standing up from a chair using your arms (e.g., wheelchair or bedside chair)?: None Help needed to walk in hospital room?: A Little Help needed climbing 3-5 steps with a railing? : A Little 6 Click Score: 22    End of Session Equipment Utilized During Treatment: Gait belt Activity Tolerance: Patient tolerated treatment well Patient left: with call bell/phone within reach;in bed;with bed alarm set Nurse Communication: Mobility status PT Visit Diagnosis: Other abnormalities of gait and mobility (R26.89);Difficulty in walking, not elsewhere classified (R26.2)     Time: 0076-2263 PT Time Calculation (min) (ACUTE ONLY): 35 min  Charges:  $Gait Training: 8-22 mins $Therapeutic Exercise: 8-22 mins                     Baxter Flattery, PT  Acute Rehab Dept [facility]) [phone]  [facility] Weekend Pager [facility] only)  [phone]  04/18/2022    [facility] 04/18/2022, 3:12 PM

## 2022-04-19 NOTE — Anesthesia Postprocedure Evaluation (Signed)
Anesthesia Post Note  Patient: Helen Gardner  Procedure(s) Performed: TOTAL HIP ARTHROPLASTY ANTERIOR APPROACH (Left: Hip)     Patient location during evaluation: PACU Anesthesia Type: Spinal Level of consciousness: oriented and awake and alert Pain management: pain level controlled Vital Signs Assessment: post-procedure vital signs reviewed and stable Respiratory status: spontaneous breathing, respiratory function stable and patient connected to nasal cannula oxygen Cardiovascular status: blood pressure returned to baseline and stable Postop Assessment: no headache, no backache and no apparent nausea or vomiting Anesthetic complications: no  No notable events documented.  Last Vitals:  Vitals:   04/18/22 1011 04/18/22 1413  BP: (!) 109/55 136/71  Pulse: 78 72  Resp: 20 20  Temp: 36.4 C 36.6 C  SpO2: 100% 99%    Last Pain:  Vitals:   04/18/22 1413  TempSrc: Oral  PainSc:                  Nada Godley L Brexton Sofia

## 2022-05-03 NOTE — Discharge Summary (Signed)
Patient ID: Helen Gardner MRN: [id number] DOB/AGE: 1945/06/25 77 y.o.  Admit date: 04/17/2022 Discharge date: 04/18/2022  Admission Diagnoses:  Left hip osteoarthritis  Discharge Diagnoses:  Principal Problem:   S/P total left hip arthroplasty   Past Medical History:  Diagnosis Date   Anemia    Arthritis    Chronic cough    due to postnasal drainage   Chronic fatigue    Complex atypical endometrial hyperplasia    Fibromyalgia    Hyperlipidemia    LDL goal = < 120, ideally < 90   hypothyroidism    Hypothyroidism    IBS (irritable bowel syndrome)    intermittent symptoms   Migraine headache     Surgeries: Procedure(s): TOTAL HIP ARTHROPLASTY ANTERIOR APPROACH on 04/17/2022   Consultants:   Discharged Condition: Improved  Hospital Course: Helen Gardner is an 77 y.o. female who was admitted 04/17/2022 for operative treatment ofS/P total left hip arthroplasty. Patient has severe unremitting pain that affects sleep, daily activities, and work/hobbies. After pre-op clearance the patient was taken to the operating room on 04/17/2022 and underwent  Procedure(s): TOTAL HIP ARTHROPLASTY ANTERIOR APPROACH.    Patient was given perioperative antibiotics:  Anti-infectives (From admission, onward)    Start     Dose/Rate Route Frequency Ordered Stop   04/18/22 0600  ceFAZolin (ANCEF) IVPB 2g/100 mL premix  Status:  Discontinued        2 g 200 mL/hr over 30 Minutes Intravenous On call to O.R. 04/17/22 1213 04/17/22 1214   04/17/22 2000  ceFAZolin (ANCEF) IVPB 2g/100 mL premix        2 g 200 mL/hr over 30 Minutes Intravenous Every 6 hours 04/17/22 1702 04/18/22 0230   04/17/22 1215  ceFAZolin (ANCEF) IVPB 2g/100 mL premix        2 g 200 mL/hr over 30 Minutes Intravenous On call to O.R. 04/17/22 1214 04/17/22 1405        Patient was given sequential compression devices, early ambulation, and chemoprophylaxis to prevent DVT. Patient worked with PT and was meeting their  goals regarding safe ambulation and transfers.  Patient benefited maximally from hospital stay and there were no complications.    Recent vital signs: No data found.   Recent laboratory studies: No results for input(s): "WBC", "HGB", "HCT", "PLT", "NA", "K", "CL", "CO2", "BUN", "CREATININE", "GLUCOSE", "INR", "CALCIUM" in the last 72 hours.  Invalid input(s): "PT", "2"   Discharge Medications:   Allergies as of 04/18/2022       Reactions   Sulfonamide Derivatives Hives, Rash   urticarial   Sulfamethoxazole Hives   Hydrocodone-acetaminophen Palpitations        Medication List     TAKE these medications    acetaminophen 500 MG tablet Commonly known as: TYLENOL Take 1,000 mg by mouth every 6 (six) hours as needed for moderate pain.   Armour Thyroid 90 MG tablet Generic drug: thyroid Take 90 mg by mouth in the morning.   aspirin 81 MG chewable tablet Chew 1 tablet (81 mg total) by mouth 2 (two) times daily for 28 days.   clobetasol 0.05 % external solution Commonly known as: TEMOVATE Apply 1 Application topically daily as needed (rash).   cromolyn 5.2 MG/ACT nasal spray Commonly known as: NASALCROM Place 1 spray into both nostrils 4 (four) times daily as needed for allergies.   estradiol 0.05 MG/24HR patch Commonly known as: VIVELLE-DOT Place 1 patch onto the skin 2 (two) times a week. Changes on Sun and Jeris Penta  fluocinonide 0.05 % external solution Commonly known as: LIDEX Apply 1 Application topically 2 (two) times daily.   HYDROmorphone 2 MG tablet Commonly known as: DILAUDID Take 1 tablet (2 mg total) by mouth every 4 (four) hours as needed for severe pain.   Ivermectin 1 % Crea Apply 1 Application topically at bedtime.   MAGNESIUM PO Take 1 tablet by mouth 4 (four) times a week.   METAMUCIL PO Take 1 Scoop by mouth daily.   methocarbamol 500 MG tablet Commonly known as: ROBAXIN Take 1 tablet (500 mg total) by mouth every 6 (six) hours as needed for  muscle spasms.   polyethylene glycol 17 g packet Commonly known as: MIRALAX / GLYCOLAX Take 17 g by mouth 2 (two) times daily.   SYSTANE OP Place 1 drop into both eyes daily as needed (for dry eyes).   traZODone 100 MG tablet Commonly known as: DESYREL Take 100 mg by mouth at bedtime.   zolpidem 5 MG tablet Commonly known as: AMBIEN Take 5 mg by mouth at bedtime as needed for sleep.       ASK your doctor about these medications    senna 8.6 MG Tabs tablet Commonly known as: SENOKOT Take 1 tablet (8.6 mg total) by mouth at bedtime for 14 days. Ask about: Should I take this medication?               Discharge Care Instructions  (From admission, onward)           Start     Ordered   04/18/22 0000  Change dressing       Comments: Maintain surgical dressing until follow up in the clinic. If the edges start to pull up, may reinforce with tape. If the dressing is no longer working, may remove and cover with gauze and tape, but must keep the area dry and clean.  Call with any questions or concerns.   04/18/22 1550            Diagnostic Studies: DG Pelvis Portable  Result Date: 04/17/2022 CLINICAL DATA:  Status post total hip arthroplasty-left. Postoperative. EXAM: PORTABLE PELVIS 1-2 VIEWS COMPARISON:  None Available. FINDINGS: Status post total left hip arthroplasty. No perihardware lucency is seen to indicate hardware failure or loosening. Expected postoperative subcutaneous air lateral to the arthroplasty hardware. Mild pubic symphysis joint space narrowing and peripheral osteophytosis. Moderate to severe superolateral right femoroacetabular joint space narrowing with moderate superolateral right acetabular degenerative osteophytosis. Mild right femoral head-neck junction degenerative osteophytosis. No acute fracture or dislocation. Vascular phleboliths overlie the pelvis. IMPRESSION: Status post total left hip arthroplasty without evidence of hardware failure.  Electronically Signed   By: Yvonne Kendall M.D.   On: 04/17/2022 15:46   DG HIP UNILAT WITH PELVIS 1V LEFT  Result Date: 04/17/2022 CLINICAL DATA:  Left hip arthroplasty EXAM: DG HIP (WITH OR WITHOUT PELVIS) 1V*L* COMPARISON:  None Available. FINDINGS: Eight fluoroscopic images are obtained during the performance of procedure and are provided for interpretation only. Initial images demonstrate left hip osteoarthritis, with subsequent placement of a left hip arthroplasty. Alignment is anatomic. Please refer to operative report. Fluoroscopy time: 7 seconds, 0.996 mGy IMPRESSION: 1. Intraoperative evaluation as above. Electronically Signed   By: Randa Ngo M.D.   On: 04/17/2022 15:05   DG C-Arm 1-60 Min-No Report  Result Date: 04/17/2022 Fluoroscopy was utilized by the requesting physician.  No radiographic interpretation.   DG C-Arm 1-60 Min-No Report  Result Date: 04/17/2022 Fluoroscopy was utilized by  the requesting physician.  No radiographic interpretation.   CT MAXILLOFACIAL WO CONTRAST  Result Date: 04/11/2022 CLINICAL DATA:  Pain in the right side of the face. History of TMJ surgery. EXAM: CT MAXILLOFACIAL WITHOUT CONTRAST TECHNIQUE: Multidetector CT imaging of the maxillofacial structures was performed. Multiplanar CT image reconstructions were also generated. RADIATION DOSE REDUCTION: This exam was performed according to the departmental dose-optimization program which includes automated exposure control, adjustment of the mA and/or kV according to patient size and/or use of iterative reconstruction technique. COMPARISON:  None Available. FINDINGS: Osseous: No fracture or mandibular dislocation. No destructive process. Mild flattening of the mandibular heads, greater on the left consistent with degenerative change. Orbits: Negative. No traumatic or inflammatory finding. Sinuses: Clear. Temporal bones: Clear mastoid air cells and middle ear cavities. No abnormality of the ossicular change.  Normal appearance of the inner ear structures and internal auditory canals. Soft tissues: No soft tissue mass.  No inflammatory changes. Limited intracranial: No significant or unexpected finding. IMPRESSION: 1. No acute findings.  No fracture or bone lesion. 2. Normally aligned temporomandibular joints. Mild mandibular head flattening, left greater than right consistent with degenerative change. 3. No other abnormalities. Electronically Signed   By: Lajean Manes M.D.   On: 04/11/2022 16:33    Disposition: Discharge disposition: 01-Home or Self Care       Discharge Instructions     Call MD / Call 911   Complete by: As directed    If you experience chest pain or shortness of breath, CALL 911 and be transported to the hospital emergency room.  If you develope a fever above 101 F, pus (white drainage) or increased drainage or redness at the wound, or calf pain, call your surgeon's office.   Change dressing   Complete by: As directed    Maintain surgical dressing until follow up in the clinic. If the edges start to pull up, may reinforce with tape. If the dressing is no longer working, may remove and cover with gauze and tape, but must keep the area dry and clean.  Call with any questions or concerns.   Constipation Prevention   Complete by: As directed    Drink plenty of fluids.  Prune juice may be helpful.  You may use a stool softener, such as Colace (over the counter) 100 mg twice a day.  Use MiraLax (over the counter) for constipation as needed.   Diet - low sodium heart healthy   Complete by: As directed    Increase activity slowly as tolerated   Complete by: As directed    Weight bearing as tolerated with assist device (walker, cane, etc) as directed, use it as long as suggested by your surgeon or therapist, typically at least 4-6 weeks.   Post-operative opioid taper instructions:   Complete by: As directed    POST-OPERATIVE OPIOID TAPER INSTRUCTIONS: It is important to wean off of  your opioid medication as soon as possible. If you do not need pain medication after your surgery it is ok to stop day one. Opioids include: Codeine, Hydrocodone(Norco, Vicodin), Oxycodone(Percocet, oxycontin) and hydromorphone amongst others.  Long term and even short term use of opiods can cause: Increased pain response Dependence Constipation Depression Respiratory depression And more.  Withdrawal symptoms can include Flu like symptoms Nausea, vomiting And more Techniques to manage these symptoms Hydrate well Eat regular healthy meals Stay active Use relaxation techniques(deep breathing, meditating, yoga) Do Not substitute Alcohol to help with tapering If you have been on opioids  for less than two weeks and do not have pain than it is ok to stop all together.  Plan to wean off of opioids This plan should start within one week post op of your joint replacement. Maintain the same interval or time between taking each dose and first decrease the dose.  Cut the total daily intake of opioids by one tablet each day Next start to increase the time between doses. The last dose that should be eliminated is the evening dose.      TED hose   Complete by: As directed    Use stockings (TED hose) for 2 weeks on both leg(s).  You may remove them at night for sleeping.        Follow-up Information     Irving Copas, PA-C. Go on 05/02/2022.   Specialty: Orthopedic Surgery Why: You are scheduled for a follow up appointment on 05-02-22 at 12:00 pm. Contact information: [address] [place] [address] [phone]                  Signed: Irving Copas 05/03/2022, 7:13 AM

## 2022-06-04 DIAGNOSIS — R002 Palpitations: Secondary | ICD-10-CM | POA: Diagnosis not present

## 2022-06-04 DIAGNOSIS — R7989 Other specified abnormal findings of blood chemistry: Secondary | ICD-10-CM | POA: Diagnosis not present

## 2022-06-04 DIAGNOSIS — E039 Hypothyroidism, unspecified: Secondary | ICD-10-CM | POA: Diagnosis not present

## 2022-06-04 DIAGNOSIS — E785 Hyperlipidemia, unspecified: Secondary | ICD-10-CM | POA: Diagnosis not present

## 2022-06-07 DIAGNOSIS — Z5189 Encounter for other specified aftercare: Secondary | ICD-10-CM | POA: Diagnosis not present

## 2022-06-07 DIAGNOSIS — M1712 Unilateral primary osteoarthritis, left knee: Secondary | ICD-10-CM | POA: Diagnosis not present

## 2022-06-11 DIAGNOSIS — Z0000 Encounter for general adult medical examination without abnormal findings: Secondary | ICD-10-CM | POA: Diagnosis not present

## 2022-06-11 DIAGNOSIS — I2584 Coronary atherosclerosis due to calcified coronary lesion: Secondary | ICD-10-CM | POA: Diagnosis not present

## 2022-06-11 DIAGNOSIS — F5104 Psychophysiologic insomnia: Secondary | ICD-10-CM | POA: Diagnosis not present

## 2022-06-11 DIAGNOSIS — E785 Hyperlipidemia, unspecified: Secondary | ICD-10-CM | POA: Diagnosis not present

## 2022-06-11 DIAGNOSIS — R911 Solitary pulmonary nodule: Secondary | ICD-10-CM | POA: Diagnosis not present

## 2022-06-11 DIAGNOSIS — Z1331 Encounter for screening for depression: Secondary | ICD-10-CM | POA: Diagnosis not present

## 2022-06-11 DIAGNOSIS — R82998 Other abnormal findings in urine: Secondary | ICD-10-CM | POA: Diagnosis not present

## 2022-06-11 DIAGNOSIS — M1990 Unspecified osteoarthritis, unspecified site: Secondary | ICD-10-CM | POA: Diagnosis not present

## 2022-06-11 DIAGNOSIS — E039 Hypothyroidism, unspecified: Secondary | ICD-10-CM | POA: Diagnosis not present

## 2022-06-11 DIAGNOSIS — Z1339 Encounter for screening examination for other mental health and behavioral disorders: Secondary | ICD-10-CM | POA: Diagnosis not present

## 2022-06-11 DIAGNOSIS — G720 Drug-induced myopathy: Secondary | ICD-10-CM | POA: Diagnosis not present

## 2022-06-11 DIAGNOSIS — R69 Illness, unspecified: Secondary | ICD-10-CM | POA: Diagnosis not present

## 2022-06-11 DIAGNOSIS — M797 Fibromyalgia: Secondary | ICD-10-CM | POA: Diagnosis not present

## 2022-06-11 DIAGNOSIS — N8500 Endometrial hyperplasia, unspecified: Secondary | ICD-10-CM | POA: Diagnosis not present

## 2022-06-20 DIAGNOSIS — M25552 Pain in left hip: Secondary | ICD-10-CM | POA: Diagnosis not present

## 2022-06-20 DIAGNOSIS — M25562 Pain in left knee: Secondary | ICD-10-CM | POA: Diagnosis not present

## 2022-06-28 DIAGNOSIS — M25552 Pain in left hip: Secondary | ICD-10-CM | POA: Diagnosis not present

## 2022-07-11 ENCOUNTER — Encounter: Payer: Self-pay | Admitting: Gastroenterology

## 2022-07-18 DIAGNOSIS — M25552 Pain in left hip: Secondary | ICD-10-CM | POA: Diagnosis not present

## 2022-07-19 DIAGNOSIS — Z96642 Presence of left artificial hip joint: Secondary | ICD-10-CM | POA: Diagnosis not present

## 2022-07-19 DIAGNOSIS — Z471 Aftercare following joint replacement surgery: Secondary | ICD-10-CM | POA: Diagnosis not present

## 2022-10-01 ENCOUNTER — Encounter: Payer: Self-pay | Admitting: Gastroenterology

## 2022-12-04 DIAGNOSIS — D225 Melanocytic nevi of trunk: Secondary | ICD-10-CM | POA: Diagnosis not present

## 2022-12-04 DIAGNOSIS — L570 Actinic keratosis: Secondary | ICD-10-CM | POA: Diagnosis not present

## 2022-12-04 DIAGNOSIS — L821 Other seborrheic keratosis: Secondary | ICD-10-CM | POA: Diagnosis not present

## 2022-12-04 DIAGNOSIS — D2261 Melanocytic nevi of right upper limb, including shoulder: Secondary | ICD-10-CM | POA: Diagnosis not present

## 2022-12-04 DIAGNOSIS — D2362 Other benign neoplasm of skin of left upper limb, including shoulder: Secondary | ICD-10-CM | POA: Diagnosis not present

## 2022-12-04 DIAGNOSIS — D2262 Melanocytic nevi of left upper limb, including shoulder: Secondary | ICD-10-CM | POA: Diagnosis not present

## 2022-12-04 DIAGNOSIS — D2272 Melanocytic nevi of left lower limb, including hip: Secondary | ICD-10-CM | POA: Diagnosis not present

## 2022-12-04 DIAGNOSIS — L400 Psoriasis vulgaris: Secondary | ICD-10-CM | POA: Diagnosis not present

## 2022-12-04 DIAGNOSIS — D2271 Melanocytic nevi of right lower limb, including hip: Secondary | ICD-10-CM | POA: Diagnosis not present

## 2022-12-07 ENCOUNTER — Other Ambulatory Visit: Payer: Self-pay | Admitting: Obstetrics and Gynecology

## 2022-12-07 DIAGNOSIS — Z1231 Encounter for screening mammogram for malignant neoplasm of breast: Secondary | ICD-10-CM

## 2022-12-11 DIAGNOSIS — Z6825 Body mass index (BMI) 25.0-25.9, adult: Secondary | ICD-10-CM | POA: Diagnosis not present

## 2022-12-11 DIAGNOSIS — N959 Unspecified menopausal and perimenopausal disorder: Secondary | ICD-10-CM | POA: Diagnosis not present

## 2022-12-11 DIAGNOSIS — N952 Postmenopausal atrophic vaginitis: Secondary | ICD-10-CM | POA: Diagnosis not present

## 2022-12-11 DIAGNOSIS — Z01419 Encounter for gynecological examination (general) (routine) without abnormal findings: Secondary | ICD-10-CM | POA: Diagnosis not present

## 2022-12-11 IMAGING — DX DG CHEST 1V PORT
1 series · 1 of 1 positions shown · non-contrast
Comparison: Chest CT dated 10/26/2019.

CLINICAL DATA: Hypotension. Status post total hysterectomy.
Subcutaneous emphysema.

EXAM:
PORTABLE CHEST 1 VIEW

[chest ap]
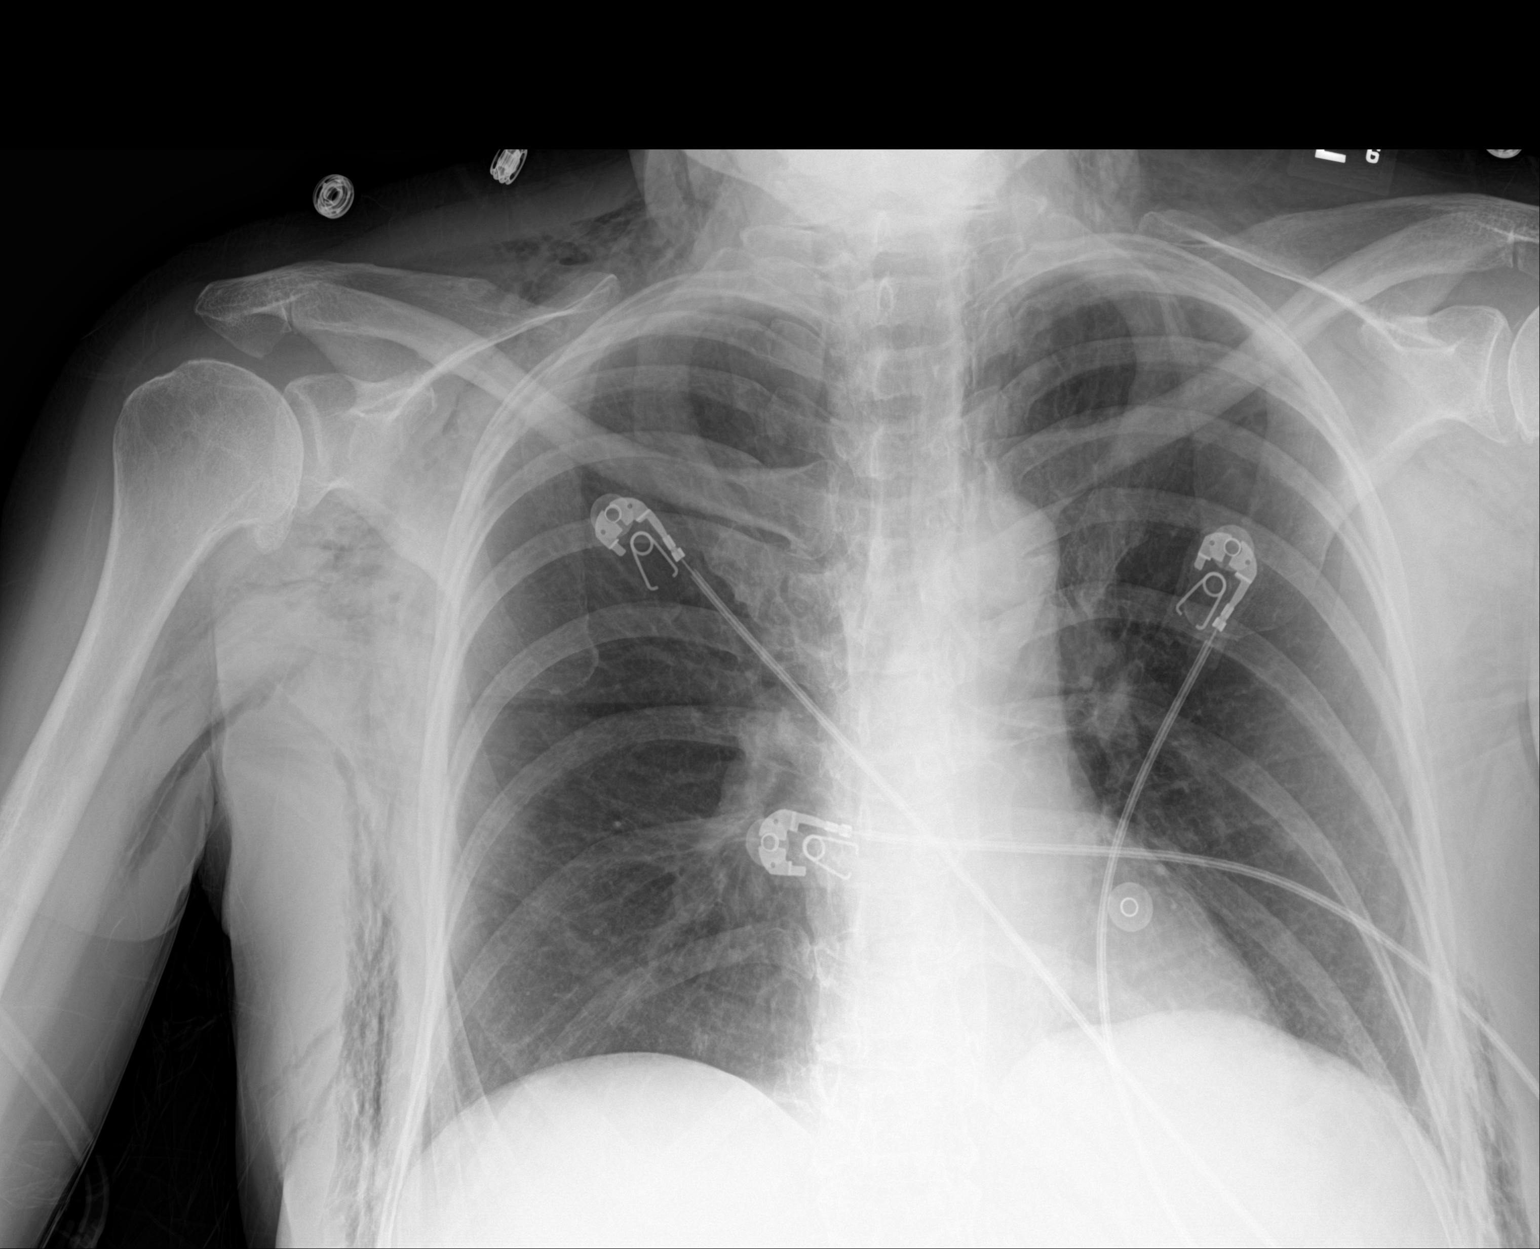

[1 of 1 positions shown; findings below may reference images not displayed]

FINDINGS: No focal consolidation, pleural effusion, or pneumothorax. The
cardiac silhouette is within normal limits. Streaky air over the
mediastinum may represent subcutaneous air. Pneumomediastinum is not
excluded. There is moderate amount of subcutaneous air in the chest
wall and neck. No acute osseous pathology.
IMPRESSION: 1. No acute cardiopulmonary process.
2. Subcutaneous emphysema.

## 2022-12-26 DIAGNOSIS — H524 Presbyopia: Secondary | ICD-10-CM | POA: Diagnosis not present

## 2023-01-01 ENCOUNTER — Ambulatory Visit
Admission: RE | Admit: 2023-01-01 | Discharge: 2023-01-01 | Disposition: A | Discharge status: Home / Self Care | Payer: Medicare HMO | Source: Ambulatory Visit | Attending: Obstetrics and Gynecology | Admitting: Obstetrics and Gynecology

## 2023-01-01 DIAGNOSIS — Z1231 Encounter for screening mammogram for malignant neoplasm of breast: Secondary | ICD-10-CM

## 2023-01-03 ENCOUNTER — Other Ambulatory Visit: Payer: Self-pay | Admitting: Obstetrics and Gynecology

## 2023-01-03 ENCOUNTER — Encounter: Payer: Self-pay | Admitting: Obstetrics and Gynecology

## 2023-01-03 DIAGNOSIS — R928 Other abnormal and inconclusive findings on diagnostic imaging of breast: Secondary | ICD-10-CM

## 2023-01-03 DIAGNOSIS — N6320 Unspecified lump in the left breast, unspecified quadrant: Secondary | ICD-10-CM

## 2023-01-08 ENCOUNTER — Ambulatory Visit
Admission: RE | Admit: 2023-01-08 | Discharge: 2023-01-08 | Disposition: A | Discharge status: Home / Self Care | Payer: Medicare HMO | Source: Ambulatory Visit | Attending: Obstetrics and Gynecology | Admitting: Obstetrics and Gynecology

## 2023-01-08 DIAGNOSIS — N6002 Solitary cyst of left breast: Secondary | ICD-10-CM | POA: Diagnosis not present

## 2023-01-08 DIAGNOSIS — R928 Other abnormal and inconclusive findings on diagnostic imaging of breast: Secondary | ICD-10-CM

## 2023-01-24 ENCOUNTER — Ambulatory Visit: Payer: Medicare HMO

## 2023-01-24 ENCOUNTER — Encounter: Payer: Self-pay | Admitting: Gastroenterology

## 2023-01-24 VITALS — Ht 67.0 in | Wt 158.0 lb

## 2023-01-24 DIAGNOSIS — Z860100 Personal history of colon polyps, unspecified: Secondary | ICD-10-CM

## 2023-01-24 DIAGNOSIS — Z800 Family history of malignant neoplasm of digestive organs: Secondary | ICD-10-CM

## 2023-01-24 NOTE — Progress Notes (Signed)
Pre visit completed via phone call; Patient verified name, DOB, and address; No egg or soy allergy known to patient;  No issues known to pt with past sedation with any surgeries or procedures; Patient denies ever being told they had issues or difficulty with intubation;  No FH of Malignant Hyperthermia; Pt is not on diet pills; Pt is not on home 02;  Pt is not on blood thinners; Pt denies issues with constipation;  No A fib or A flutter; Have any cardiac testing pending--NO Insurance verified during PV appt--- [employer]  Pt can ambulate without assistance;  Pt denies use of chewing tobacco Discussed diabetic/weight loss medication holds; Discussed NSAID holds; Checked BMI to be less than 50; Pt instructed to use [employer] or GoodRx for a price reduction on prep;  Patient's chart reviewed by Cathlyn Parsons CNRA prior to previsit and patient appropriate for the [facility];  Pre visit completed and red dot placed by patient's name on their procedure day (on provider's schedule);  Instructions sent to MyChart per her request;

## 2023-02-01 DIAGNOSIS — E785 Hyperlipidemia, unspecified: Secondary | ICD-10-CM | POA: Diagnosis not present

## 2023-02-13 ENCOUNTER — Encounter: Payer: Self-pay | Admitting: Gastroenterology

## 2023-02-13 ENCOUNTER — Ambulatory Visit: Payer: Medicare HMO | Admitting: Gastroenterology

## 2023-02-13 VITALS — BP 127/70 | HR 65 | Temp 98.1°F | Resp 11 | Ht 67.0 in | Wt 158.0 lb

## 2023-02-13 DIAGNOSIS — E039 Hypothyroidism, unspecified: Secondary | ICD-10-CM | POA: Diagnosis not present

## 2023-02-13 DIAGNOSIS — Z800 Family history of malignant neoplasm of digestive organs: Secondary | ICD-10-CM | POA: Diagnosis not present

## 2023-02-13 DIAGNOSIS — Z860100 Personal history of colon polyps, unspecified: Secondary | ICD-10-CM

## 2023-02-13 DIAGNOSIS — D123 Benign neoplasm of transverse colon: Secondary | ICD-10-CM

## 2023-02-13 DIAGNOSIS — K635 Polyp of colon: Secondary | ICD-10-CM | POA: Diagnosis not present

## 2023-02-13 DIAGNOSIS — Z09 Encounter for follow-up examination after completed treatment for conditions other than malignant neoplasm: Secondary | ICD-10-CM

## 2023-02-13 DIAGNOSIS — M797 Fibromyalgia: Secondary | ICD-10-CM | POA: Diagnosis not present

## 2023-02-13 MED ORDER — SODIUM CHLORIDE 0.9 % IV SOLN: 500.0000 mL | INTRAVENOUS | Status: DC

## 2023-02-13 NOTE — Progress Notes (Signed)
Vss nad trans to pacu 

## 2023-02-13 NOTE — Progress Notes (Signed)
Called to room to assist during endoscopic procedure.  Patient ID and intended procedure confirmed with present staff. Received instructions for my participation in the procedure from the performing physician.  

## 2023-02-13 NOTE — Progress Notes (Signed)
Pt's states no medical or surgical changes since previsit or office visit. 

## 2023-02-13 NOTE — Patient Instructions (Signed)
Discharge instructions given. Handouts on polyps,Diverticulosis and Hemorrhoids and High Fiber diet.  Resume previous medications. Use Fiber Con 1-2 tablets by mouth daily. YOU HAD AN ENDOSCOPIC PROCEDURE TODAY AT THE [facility]:   Refer to the procedure report that was given to you for any specific questions about what was found during the examination.  If the procedure report does not answer your questions, please call your gastroenterologist to clarify.  If you requested that your care partner not be given the details of your procedure findings, then the procedure report has been included in a sealed envelope for you to review at your convenience later.  YOU SHOULD EXPECT: Some feelings of bloating in the abdomen. Passage of more gas than usual.  Walking can help get rid of the air that was put into your GI tract during the procedure and reduce the bloating. If you had a lower endoscopy (such as a colonoscopy or flexible sigmoidoscopy) you may notice spotting of blood in your stool or on the toilet paper. If you underwent a bowel prep for your procedure, you may not have a normal bowel movement for a few days.  Please Note:  You might notice some irritation and congestion in your nose or some drainage.  This is from the oxygen used during your procedure.  There is no need for concern and it should clear up in a day or so.  SYMPTOMS TO REPORT IMMEDIATELY:  Following lower endoscopy (colonoscopy or flexible sigmoidoscopy):  Excessive amounts of blood in the stool  Significant tenderness or worsening of abdominal pains  Swelling of the abdomen that is new, acute  Fever of 100F or higher   For urgent or emergent issues, a gastroenterologist can be reached at any hour by calling [phone]. Do not use MyChart messaging for urgent concerns.    DIET:  We do recommend a small meal at first, but then you may proceed to your regular diet.  Drink plenty of fluids but you should  avoid alcoholic beverages for 24 hours.  ACTIVITY:  You should plan to take it easy for the rest of today and you should NOT DRIVE or use heavy machinery until tomorrow (because of the sedation medicines used during the test).    FOLLOW UP: Our staff will call the number listed on your records the next business day following your procedure.  We will call around 7:15- 8:00 am to check on you and address any questions or concerns that you may have regarding the information given to you following your procedure. If we do not reach you, we will leave a message.     If any biopsies were taken you will be contacted by phone or by letter within the next 1-3 weeks.  Please call us at [phone] if you have not heard about the biopsies in 3 weeks.    SIGNATURES/CONFIDENTIALITY: You and/or your care partner have signed paperwork which will be entered into your electronic medical record.  These signatures attest to the fact that that the information above on your After Visit Summary has been reviewed and is understood.  Full responsibility of the confidentiality of this discharge information lies with you and/or your care-partner.

## 2023-02-13 NOTE — Op Note (Signed)
[facility] Patient Name: Helen Gardner Procedure Date: 02/13/2023 10:13 AM MRN: [id number] Endoscopist: Corliss Parish , MD, [id number] Age: 77 Referring MD:  Date of Birth: 07/26/1945 Gender: Female Account #: [id number] Procedure:                Colonoscopy Indications:              Surveillance: Personal history of adenomatous                            polyps on last colonoscopy 3 years ago, Family                            history of colon cancer in a first-degree relative Medicines:                Monitored Anesthesia Care Procedure:                Pre-Anesthesia Assessment:                           - Prior to the procedure, a History and Physical                            was performed, and patient medications and                            allergies were reviewed. The patient's tolerance of                            previous anesthesia was also reviewed. The risks                            and benefits of the procedure and the sedation                            options and risks were discussed with the patient.                            All questions were answered, and informed consent                            was obtained. Prior Anticoagulants: The patient has                            taken no anticoagulant or antiplatelet agents. ASA                            Grade Assessment: II - A patient with mild systemic                            disease. After reviewing the risks and benefits,                            the patient was deemed in satisfactory condition to  undergo the procedure.                           After obtaining informed consent, the colonoscope                            was passed under direct vision. Throughout the                            procedure, the patient's blood pressure, pulse, and                            oxygen saturations were monitored continuously. The                             [id number]-HQ190L Colonoscope [id number] was introduced                            through the anus and advanced to the the cecum,                            identified by appendiceal orifice and ileocecal                            valve. The colonoscopy was performed without                            difficulty. The patient tolerated the procedure.                            The quality of the bowel preparation was good. The                            terminal ileum, ileocecal valve, appendiceal                            orifice, and rectum were photographed. Scope In: 10:17:47 AM Scope Out: 10:33:37 AM Scope Withdrawal Time: 0 hours 10 minutes 13 seconds  Total Procedure Duration: 0 hours 15 minutes 50 seconds  Findings:                 Hemorrhoids were found on perianal exam.                           The colon (entire examined portion) was                            significantly tortuous.                           Three sessile polyps were found in the splenic                            flexure (1) and hepatic flexure (2). The polyps  were 2 to 4 mm in size. These polyps were removed                            with a cold snare. Resection and retrieval were                            complete.                           Multiple small-mouthed diverticula were found in                            the recto-sigmoid colon and sigmoid colon.                           Normal mucosa was found in the entire colon.                           Non-bleeding non-thrombosed external and internal                            hemorrhoids were found during retroflexion, during                            perianal exam and during digital exam. The                            hemorrhoids were Grade III (internal hemorrhoids                            that prolapse but require manual reduction). Complications:            No immediate complications. Estimated Blood Loss:     Estimated blood  loss was minimal. Impression:               - Hemorrhoids found on perianal exam.                           - Tortuous colon.                           - Three 2 to 4 mm polyps at the splenic flexure and                            at the hepatic flexure, removed with a cold snare.                            Resected and retrieved.                           - Diverticulosis in the recto-sigmoid colon and in                            the sigmoid colon.                           -  Normal mucosa in the entire examined colon                            otherwise.                           - Non-bleeding non-thrombosed external and internal                            hemorrhoids. Recommendation:           - The patient will be observed post-procedure,                            until all discharge criteria are met.                           - Discharge patient to home.                           - Patient has a contact number available for                            emergencies. The signs and symptoms of potential                            delayed complications were discussed with the                            patient. Return to normal activities tomorrow.                            Written discharge instructions were provided to the                            patient.                           - High fiber diet.                           - Use FiberCon 1-2 tablets PO daily.                           - Continue present medications.                           - Await pathology results.                           - Repeat colonoscopy in 3 - 5 years for                            surveillance based on pathology results.                           - The findings and recommendations were discussed  with the patient.                           - The findings and recommendations were discussed                            with the patient's family. Corliss Parish,  MD 02/13/2023 10:42:36 AM

## 2023-02-13 NOTE — Progress Notes (Signed)
GASTROENTEROLOGY PROCEDURE H&P NOTE   Primary Care Physician: Alysia Penna, MD  HPI: Helen Gardner is a 77 y.o. female who presents for Colonoscopy for surveillance of previous polyps and for FHx of Colon Cancer.  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89   COLONOSCOPY  08/30/2011   Dr Sherin Quarry   COLONOSCOPY  2021   GM-MAC-miralax(good)-[facility]   DILATION AND CURETTAGE OF UTERUS N/A 03/2021   LAPAROSCOPY N/A 06/15/2021   Procedure: LAPAROSCOPY DIAGNOSTIC WITH WASHOUT;  Surgeon: Carver Fila, MD;  Location: [facility];  Service: Gynecology;  Laterality: N/A;   POLYPECTOMY  2021   [facility]   ROBOTIC ASSISTED TOTAL HYSTERECTOMY WITH BILATERAL SALPINGO OOPHERECTOMY N/A 06/14/2021   Procedure: XI ROBOTIC ASSISTED TOTAL HYSTERECTOMY WITH BILATERAL SALPINGO OOPHORECTOMY; CYSTOSCOPY;  Surgeon: Carver Fila, MD;  Location: [facility];  Service: Gynecology;  Laterality: N/A;   SENTINEL NODE BIOPSY N/A 06/14/2021   Procedure: SENTINEL NODE BIOPSY;  Surgeon: Carver Fila, MD;  Location: [facility];  Service: Gynecology;  Laterality: N/A;   TEMPOROMANDIBULAR JOINT SURGERY N/A    thyroglossal duct cystectomy     TONSILLECTOMY     TOTAL HIP ARTHROPLASTY Left 04/17/2022   Procedure: TOTAL HIP ARTHROPLASTY ANTERIOR APPROACH;  Surgeon: Durene Romans, MD;  Location: [facility];  Service: Orthopedics;  Laterality: Left;   WISDOM TOOTH EXTRACTION     Current Outpatient Medications  Medication Sig Dispense Refill   ARMOUR THYROID 90 MG tablet Take 90 mg by mouth in the morning.     cromolyn  (NASALCROM) 5.2 MG/ACT nasal spray Place 1 spray into both nostrils 4 (four) times daily as needed for allergies.     estradiol (VIVELLE-DOT) 0.05 MG/24HR patch Place 1 patch onto the skin once a week. Changes on Sun and Wen     fluocinonide (LIDEX) 0.05 % external solution Apply 1 Application topically 2 (two) times daily.     MAGNESIUM PO Take 1 tablet by mouth daily at 6 (six) AM.     traZODone (DESYREL) 100 MG tablet Take 100 mg by mouth at bedtime.       acetaminophen (TYLENOL) 500 MG tablet Take 1,000 mg by mouth every 6 (six) hours as needed for moderate pain.     amoxicillin (AMOXIL) 500 MG capsule Prior to dental procedures     clobetasol (TEMOVATE) 0.05 % external solution Apply 1 Application topically daily as needed (rash).     Ivermectin 1 % CREA Apply 1 Application topically at bedtime.     Polyethyl Glycol-Propyl Glycol (SYSTANE OP) Place 1 drop into both eyes daily as needed (for dry eyes).     Psyllium (METAMUCIL PO) Take 1 Scoop by mouth daily.     zolpidem (AMBIEN) 5 MG tablet Take 5 mg by mouth at bedtime as needed for sleep.     Current Facility-Administered Medications  Medication Dose Route Frequency Provider Last Rate Last Admin   0.9 %  sodium chloride infusion  500 mL  Intravenous Continuous Mansouraty, Netty Starring., MD        Current Outpatient Medications:    ARMOUR THYROID 90 MG tablet, Take 90 mg by mouth in the morning., Disp: , Rfl:    cromolyn (NASALCROM) 5.2 MG/ACT nasal spray, Place 1 spray into both nostrils 4 (four) times daily as needed for allergies., Disp: , Rfl:    estradiol (VIVELLE-DOT) 0.05 MG/24HR patch, Place 1 patch onto the skin once a week. Changes on Sun and Wen, Disp: , Rfl:    fluocinonide (LIDEX) 0.05 % external solution, Apply 1 Application topically 2 (two) times daily., Disp: , Rfl:    MAGNESIUM PO, Take 1 tablet by mouth daily at 6 (six) AM., Disp: , Rfl:    traZODone (DESYREL) 100 MG tablet, Take 100 mg by mouth at bedtime.  , Disp: ,  Rfl:    acetaminophen (TYLENOL) 500 MG tablet, Take 1,000 mg by mouth every 6 (six) hours as needed for moderate pain., Disp: , Rfl:    amoxicillin (AMOXIL) 500 MG capsule, Prior to dental procedures, Disp: , Rfl:    clobetasol (TEMOVATE) 0.05 % external solution, Apply 1 Application topically daily as needed (rash)., Disp: , Rfl:    Ivermectin 1 % CREA, Apply 1 Application topically at bedtime., Disp: , Rfl:    Polyethyl Glycol-Propyl Glycol (SYSTANE OP), Place 1 drop into both eyes daily as needed (for dry eyes)., Disp: , Rfl:    Psyllium (METAMUCIL PO), Take 1 Scoop by mouth daily., Disp: , Rfl:    zolpidem (AMBIEN) 5 MG tablet, Take 5 mg by mouth at bedtime as needed for sleep., Disp: , Rfl:   Current Facility-Administered Medications:    0.9 %  sodium chloride infusion, 500 mL, Intravenous, Continuous, Mansouraty, Netty Starring., MD Allergies  Allergen Reactions   Sulfonamide Derivatives Hives and Rash    urticarial    Hydrocodone Palpitations    Hyperactivity; heart racing!   Hydrocodone-Acetaminophen Palpitations   Sulfamethoxazole Hives and Rash   Family History  Problem Relation Age of Onset   Colon polyps Mother 84   Cancer Mother        colon, pancreatic,   Colon cancer Mother 34   Pancreatic cancer Mother    Cancer Father        lung, smoker   Hypertension Brother    Hyperlipidemia Brother    Colon polyps Brother 14   Lupus Paternal Aunt    Arthritis Paternal Aunt         RA in 2; 4 aunts with arthritis   Cancer Paternal Aunt        uterine   Heart disease Maternal Grandfather        MI @ 7   Cancer Maternal Grandfather        pancreatic   Pancreatic cancer Maternal Grandfather    Hyperlipidemia Paternal Grandmother    Stroke Paternal Grandmother    Tourette syndrome Son    Colon cancer Maternal Great-grandfather        age unknown   Esophageal cancer Neg Hx    Rectal cancer Neg Hx    Stomach cancer Neg Hx    Breast cancer Neg Hx    Ovarian cancer Neg Hx     Endometrial cancer Neg Hx    Prostate cancer Neg Hx    Social History   Socioeconomic History   Marital status: Married    Spouse name: Not on file   Number of children: Not on file   Years of  education: Not on file   Highest education level: Not on file  Occupational History   Occupation: retired  Tobacco Use   Smoking status: Former    Current packs/day: 0.00    Types: Cigarettes    Quit date: 04/02/1998    Years since quitting: 24.8   Smokeless tobacco: Never   Tobacco comments:    quit 2000  Vaping Use   Vaping status: Never Used  Substance and Sexual Activity   Alcohol use: Yes    Alcohol/week: 14.0 standard drinks of alcohol    Types: 14 Glasses of wine per week   Drug use: No   Sexual activity: Not Currently  Other Topics Concern   Not on file  Social History Narrative   Gets reg exercise   Social Determinants of Health   Financial Resource Strain: Not on file  Food Insecurity: No Food Insecurity (04/17/2022)   Hunger Vital Sign    Worried About Running Out of Food in the Last Year: Never true    Ran Out of Food in the Last Year: Never true  Transportation Needs: No Transportation Needs (04/17/2022)   PRAPARE - Administrator, Civil Service (Medical): No    Lack of Transportation (Non-Medical): No  Physical Activity: Not on file  Stress: Not on file  Social Connections: Not on file  Intimate Partner Violence: Not At Risk (04/17/2022)   Humiliation, Afraid, Rape, and Kick questionnaire    Fear of Current or Ex-Partner: No    Emotionally Abused: No    Physically Abused: No    Sexually Abused: No    Physical Exam: Today's Vitals   02/13/23 0948  BP: 139/85  Pulse: 93  Temp: 98.1 F (36.7 C)  TempSrc: Temporal  SpO2: 99%  Weight: 158 lb (71.7 kg)  Height: 5\' 7"  (1.702 m)   Body mass index is 24.75 kg/m. GEN: NAD EYE: Sclerae anicteric ENT: MMM CV: Non-tachycardic GI: Soft, NT/ND NEURO:  Alert & Oriented x 3  Lab Results: No  results for input(s): "WBC", "HGB", "HCT", "PLT" in the last 72 hours. BMET No results for input(s): "NA", "K", "CL", "CO2", "GLUCOSE", "BUN", "CREATININE", "CALCIUM" in the last 72 hours. LFT No results for input(s): "PROT", "ALBUMIN", "AST", "ALT", "ALKPHOS", "BILITOT", "BILIDIR", "IBILI" in the last 72 hours. PT/INR No results for input(s): "LABPROT", "INR" in the last 72 hours.   Impression / Plan: This is a 77 y.o.female who presents for Colonoscopy for surveillance of previous polyps and for FHx of Colon Cancer.  The risks and benefits of endoscopic evaluation/treatment were discussed with the patient and/or family; these include but are not limited to the risk of perforation, infection, bleeding, missed lesions, lack of diagnosis, severe illness requiring hospitalization, as well as anesthesia and sedation related illnesses.  The patient's history has been reviewed, patient examined, no change in status, and deemed stable for procedure.  The patient and/or family is agreeable to proceed.    Corliss Parish, MD [facility] Office # [phone]

## 2023-02-14 ENCOUNTER — Telehealth: Payer: Self-pay

## 2023-02-14 NOTE — Telephone Encounter (Signed)
Attempted f/u call. No answer, left VM. 

## 2023-02-15 LAB — SURGICAL PATHOLOGY

## 2023-02-18 ENCOUNTER — Encounter: Payer: Self-pay | Admitting: Gastroenterology

## 2023-05-09 DIAGNOSIS — Z96642 Presence of left artificial hip joint: Secondary | ICD-10-CM | POA: Diagnosis not present

## 2023-06-03 DIAGNOSIS — I2584 Coronary atherosclerosis due to calcified coronary lesion: Secondary | ICD-10-CM | POA: Diagnosis not present

## 2023-06-03 DIAGNOSIS — E039 Hypothyroidism, unspecified: Secondary | ICD-10-CM | POA: Diagnosis not present

## 2023-06-03 DIAGNOSIS — E785 Hyperlipidemia, unspecified: Secondary | ICD-10-CM | POA: Diagnosis not present

## 2023-06-14 DIAGNOSIS — E785 Hyperlipidemia, unspecified: Secondary | ICD-10-CM | POA: Diagnosis not present

## 2023-08-13 DIAGNOSIS — L400 Psoriasis vulgaris: Secondary | ICD-10-CM | POA: Diagnosis not present

## 2023-08-13 DIAGNOSIS — L603 Nail dystrophy: Secondary | ICD-10-CM | POA: Diagnosis not present

## 2023-08-13 DIAGNOSIS — B351 Tinea unguium: Secondary | ICD-10-CM | POA: Diagnosis not present

## 2023-11-28 ENCOUNTER — Other Ambulatory Visit: Payer: Self-pay | Admitting: Obstetrics and Gynecology

## 2023-11-28 DIAGNOSIS — Z1231 Encounter for screening mammogram for malignant neoplasm of breast: Secondary | ICD-10-CM

## 2023-12-04 DIAGNOSIS — L821 Other seborrheic keratosis: Secondary | ICD-10-CM | POA: Diagnosis not present

## 2023-12-04 DIAGNOSIS — D485 Neoplasm of uncertain behavior of skin: Secondary | ICD-10-CM | POA: Diagnosis not present

## 2023-12-04 DIAGNOSIS — L718 Other rosacea: Secondary | ICD-10-CM | POA: Diagnosis not present

## 2023-12-04 DIAGNOSIS — D2262 Melanocytic nevi of left upper limb, including shoulder: Secondary | ICD-10-CM | POA: Diagnosis not present

## 2023-12-04 DIAGNOSIS — L905 Scar conditions and fibrosis of skin: Secondary | ICD-10-CM | POA: Diagnosis not present

## 2023-12-04 DIAGNOSIS — D225 Melanocytic nevi of trunk: Secondary | ICD-10-CM | POA: Diagnosis not present

## 2023-12-04 DIAGNOSIS — L400 Psoriasis vulgaris: Secondary | ICD-10-CM | POA: Diagnosis not present

## 2023-12-04 DIAGNOSIS — D2261 Melanocytic nevi of right upper limb, including shoulder: Secondary | ICD-10-CM | POA: Diagnosis not present

## 2023-12-12 DIAGNOSIS — Z01419 Encounter for gynecological examination (general) (routine) without abnormal findings: Secondary | ICD-10-CM | POA: Diagnosis not present

## 2023-12-12 DIAGNOSIS — Z6825 Body mass index (BMI) 25.0-25.9, adult: Secondary | ICD-10-CM | POA: Diagnosis not present

## 2023-12-12 DIAGNOSIS — N952 Postmenopausal atrophic vaginitis: Secondary | ICD-10-CM | POA: Diagnosis not present

## 2023-12-12 DIAGNOSIS — N951 Menopausal and female climacteric states: Secondary | ICD-10-CM | POA: Diagnosis not present

## 2023-12-30 DIAGNOSIS — H5203 Hypermetropia, bilateral: Secondary | ICD-10-CM | POA: Diagnosis not present

## 2024-01-02 ENCOUNTER — Ambulatory Visit
Admission: RE | Admit: 2024-01-02 | Discharge: 2024-01-02 | Disposition: A | Source: Ambulatory Visit | Attending: Obstetrics and Gynecology | Admitting: Obstetrics and Gynecology

## 2024-01-02 DIAGNOSIS — Z1231 Encounter for screening mammogram for malignant neoplasm of breast: Secondary | ICD-10-CM | POA: Diagnosis not present
